# Patient Record
Sex: Female | Born: 1955 | Race: White | Hispanic: No | Marital: Married | Smoking: Never smoker
Health system: Southern US, Community
[De-identification: ages and names within clinical notes are randomized; demographics above are authoritative.]

## PROBLEM LIST (undated history)

## (undated) DIAGNOSIS — F32A Depression, unspecified: Secondary | ICD-10-CM

## (undated) DIAGNOSIS — H269 Unspecified cataract: Secondary | ICD-10-CM

## (undated) DIAGNOSIS — J45909 Unspecified asthma, uncomplicated: Secondary | ICD-10-CM

## (undated) DIAGNOSIS — R011 Cardiac murmur, unspecified: Secondary | ICD-10-CM

## (undated) DIAGNOSIS — E785 Hyperlipidemia, unspecified: Secondary | ICD-10-CM

## (undated) DIAGNOSIS — M1990 Unspecified osteoarthritis, unspecified site: Secondary | ICD-10-CM

## (undated) DIAGNOSIS — F329 Major depressive disorder, single episode, unspecified: Secondary | ICD-10-CM

## (undated) DIAGNOSIS — N189 Chronic kidney disease, unspecified: Secondary | ICD-10-CM

## (undated) DIAGNOSIS — T7840XA Allergy, unspecified, initial encounter: Secondary | ICD-10-CM

## (undated) HISTORY — DX: Major depressive disorder, single episode, unspecified: F32.9

## (undated) HISTORY — DX: Allergy, unspecified, initial encounter: T78.40XA

## (undated) HISTORY — PX: EYE SURGERY: SHX253

## (undated) HISTORY — PX: COSMETIC SURGERY: SHX468

## (undated) HISTORY — DX: Unspecified cataract: H26.9

## (undated) HISTORY — DX: Chronic kidney disease, unspecified: N18.9

## (undated) HISTORY — PX: KNEE ARTHROSCOPY: SUR90

## (undated) HISTORY — DX: Unspecified osteoarthritis, unspecified site: M19.90

## (undated) HISTORY — DX: Cardiac murmur, unspecified: R01.1

## (undated) HISTORY — PX: OTHER SURGICAL HISTORY: SHX169

## (undated) HISTORY — DX: Hyperlipidemia, unspecified: E78.5

## (undated) HISTORY — PX: WISDOM TOOTH EXTRACTION: SHX21

## (undated) HISTORY — DX: Depression, unspecified: F32.A

## (undated) HISTORY — DX: Unspecified asthma, uncomplicated: J45.909

## (undated) HISTORY — PX: COLONOSCOPY: SHX174

## (undated) HISTORY — PX: TONSILLECTOMY AND ADENOIDECTOMY: SUR1326

---

## 2005-02-12 ENCOUNTER — Ambulatory Visit: Payer: Self-pay | Admitting: Internal Medicine

## 2005-02-19 ENCOUNTER — Ambulatory Visit: Payer: Self-pay | Admitting: Internal Medicine

## 2005-03-25 ENCOUNTER — Ambulatory Visit: Payer: Self-pay | Admitting: Internal Medicine

## 2007-03-24 ENCOUNTER — Ambulatory Visit: Payer: Self-pay | Admitting: Internal Medicine

## 2007-03-24 LAB — CONVERTED CEMR LAB
ALT: 18 units/L (ref 0–35)
AST: 22 units/L (ref 0–37)
Albumin: 4 g/dL (ref 3.5–5.2)
Alkaline Phosphatase: 42 units/L (ref 39–117)
BUN: 12 mg/dL (ref 6–23)
Basophils Absolute: 0 10*3/uL (ref 0.0–0.1)
Basophils Relative: 0.2 % (ref 0.0–1.0)
Bilirubin Urine: NEGATIVE
Bilirubin, Direct: 0.1 mg/dL (ref 0.0–0.3)
CO2: 31 meq/L (ref 19–32)
Calcium: 9.1 mg/dL (ref 8.4–10.5)
Chloride: 105 meq/L (ref 96–112)
Cholesterol: 219 mg/dL (ref 0–200)
Creatinine, Ser: 1 mg/dL (ref 0.4–1.2)
Direct LDL: 136.6 mg/dL
Eosinophils Absolute: 0.2 10*3/uL (ref 0.0–0.6)
Eosinophils Relative: 2.9 % (ref 0.0–5.0)
GFR calc Af Amer: 75 mL/min
GFR calc non Af Amer: 62 mL/min
Glucose, Bld: 102 mg/dL — ABNORMAL HIGH (ref 70–99)
HCT: 37.8 % (ref 36.0–46.0)
HDL: 39.4 mg/dL (ref >39.0)
Hemoglobin: 13.5 g/dL (ref 12.0–15.0)
Lymphocytes Relative: 32.5 % (ref 12.0–46.0)
MCHC: 35.7 g/dL (ref 30.0–36.0)
MCV: 89.9 fL (ref 78.0–100.0)
Monocytes Absolute: 0.4 10*3/uL (ref 0.2–0.7)
Monocytes Relative: 7.2 % (ref 3.0–11.0)
Neutro Abs: 3 10*3/uL (ref 1.4–7.7)
Neutrophils Relative %: 57.2 % (ref 43.0–77.0)
Nitrite: NEGATIVE
Platelets: 279 10*3/uL (ref 150–400)
Potassium: 3.9 meq/L (ref 3.5–5.1)
RBC: 4.21 M/uL (ref 3.87–5.11)
RDW: 12.3 % (ref 11.5–14.6)
Sodium: 141 meq/L (ref 135–145)
Specific Gravity, Urine: 1.02
TSH: 1.61 microintl units/mL (ref 0.35–5.50)
Total Bilirubin: 0.9 mg/dL (ref 0.3–1.2)
Total CHOL/HDL Ratio: 5.6
Total Protein: 6.7 g/dL (ref 6.0–8.3)
Triglycerides: 248 mg/dL (ref 0–149)
Urobilinogen, UA: 1
VLDL: 50 mg/dL — ABNORMAL HIGH (ref 0–40)
WBC Urine, dipstick: NEGATIVE
WBC: 5.3 10*3/uL (ref 4.5–10.5)
pH: 6.5

## 2007-04-01 ENCOUNTER — Ambulatory Visit: Payer: Self-pay | Admitting: Internal Medicine

## 2007-04-15 ENCOUNTER — Encounter: Payer: Self-pay | Admitting: Internal Medicine

## 2007-04-15 ENCOUNTER — Ambulatory Visit: Payer: Self-pay

## 2007-04-19 ENCOUNTER — Telehealth: Payer: Self-pay | Admitting: Internal Medicine

## 2007-05-04 LAB — CONVERTED CEMR LAB: Pap Smear: NORMAL

## 2007-06-10 ENCOUNTER — Ambulatory Visit: Payer: Self-pay | Admitting: Cardiology

## 2007-06-16 ENCOUNTER — Encounter: Payer: Self-pay | Admitting: Cardiology

## 2007-06-16 ENCOUNTER — Ambulatory Visit: Payer: Self-pay

## 2007-06-27 ENCOUNTER — Ambulatory Visit: Payer: Self-pay | Admitting: Cardiology

## 2008-03-26 ENCOUNTER — Ambulatory Visit: Payer: Self-pay | Admitting: Internal Medicine

## 2008-03-26 LAB — CONVERTED CEMR LAB
Bilirubin Urine: NEGATIVE
Glucose, Urine, Semiquant: NEGATIVE
pH: 6

## 2008-03-27 LAB — CONVERTED CEMR LAB
Albumin: 4.1 g/dL (ref 3.5–5.2)
BUN: 19 mg/dL (ref 6–23)
Basophils Relative: 0.9 % (ref 0.0–3.0)
Bilirubin, Direct: 0.1 mg/dL (ref 0.0–0.3)
Calcium: 9.2 mg/dL (ref 8.4–10.5)
Cholesterol: 278 mg/dL (ref 0–200)
Creatinine, Ser: 1.1 mg/dL (ref 0.4–1.2)
Direct LDL: 213 mg/dL
GFR calc Af Amer: 67 mL/min
Glucose, Bld: 88 mg/dL (ref 70–99)
HCT: 38.8 % (ref 36.0–46.0)
Hemoglobin: 13.6 g/dL (ref 12.0–15.0)
MCHC: 35.1 g/dL (ref 30.0–36.0)
Monocytes Absolute: 0.3 10*3/uL (ref 0.1–1.0)
Monocytes Relative: 6.2 % (ref 3.0–12.0)
Neutro Abs: 3.1 10*3/uL (ref 1.4–7.7)
RDW: 12.2 % (ref 11.5–14.6)
Sodium: 143 meq/L (ref 135–145)
TSH: 1.53 microintl units/mL (ref 0.35–5.50)
Total CHOL/HDL Ratio: 7.2
Total Protein: 7.1 g/dL (ref 6.0–8.3)

## 2008-04-02 ENCOUNTER — Ambulatory Visit: Payer: Self-pay | Admitting: Internal Medicine

## 2008-04-02 DIAGNOSIS — E785 Hyperlipidemia, unspecified: Secondary | ICD-10-CM | POA: Insufficient documentation

## 2008-04-02 LAB — CONVERTED CEMR LAB
Glucose, Urine, Semiquant: NEGATIVE
Nitrite: NEGATIVE
Specific Gravity, Urine: 1.01
WBC Urine, dipstick: NEGATIVE
pH: 5.5

## 2008-04-05 ENCOUNTER — Telehealth: Payer: Self-pay | Admitting: Internal Medicine

## 2008-04-18 ENCOUNTER — Ambulatory Visit: Payer: Self-pay | Admitting: Cardiovascular Disease

## 2008-05-18 ENCOUNTER — Encounter: Payer: Self-pay | Admitting: Internal Medicine

## 2008-06-03 LAB — CONVERTED CEMR LAB: Pap Smear: NORMAL

## 2008-06-18 ENCOUNTER — Ambulatory Visit: Payer: Self-pay | Admitting: Internal Medicine

## 2008-06-18 LAB — CONVERTED CEMR LAB
ALT: 20 units/L (ref 0–35)
AST: 27 units/L (ref 0–37)
Bilirubin, Direct: 0.1 mg/dL (ref 0.0–0.3)
Total Bilirubin: 0.8 mg/dL (ref 0.3–1.2)
Total CHOL/HDL Ratio: 6.5

## 2008-09-10 ENCOUNTER — Ambulatory Visit (HOSPITAL_COMMUNITY): Admission: RE | Admit: 2008-09-10 | Discharge: 2008-09-10 | Payer: Self-pay | Admitting: Orthopedic Surgery

## 2008-09-10 ENCOUNTER — Ambulatory Visit: Payer: Self-pay | Admitting: Vascular Surgery

## 2008-09-10 ENCOUNTER — Encounter (INDEPENDENT_AMBULATORY_CARE_PROVIDER_SITE_OTHER): Payer: Self-pay | Admitting: Orthopedic Surgery

## 2008-09-14 ENCOUNTER — Encounter: Payer: Self-pay | Admitting: Internal Medicine

## 2008-09-14 ENCOUNTER — Telehealth: Payer: Self-pay | Admitting: Internal Medicine

## 2008-11-12 ENCOUNTER — Telehealth: Payer: Self-pay | Admitting: Internal Medicine

## 2008-11-14 ENCOUNTER — Ambulatory Visit: Payer: Self-pay | Admitting: Internal Medicine

## 2008-11-20 ENCOUNTER — Telehealth: Payer: Self-pay | Admitting: Internal Medicine

## 2008-11-20 LAB — CONVERTED CEMR LAB
ALT: 20 units/L (ref 0–35)
Bilirubin, Direct: 0 mg/dL (ref 0.0–0.3)
HDL: 43.9 mg/dL (ref >39.00)
LDL Cholesterol: 88 mg/dL (ref 0–99)
Total Bilirubin: 0.8 mg/dL (ref 0.3–1.2)
Total Protein: 6.8 g/dL (ref 6.0–8.3)
VLDL: 36 mg/dL (ref 0.0–40.0)

## 2009-02-12 ENCOUNTER — Telehealth: Payer: Self-pay | Admitting: Internal Medicine

## 2009-03-26 ENCOUNTER — Ambulatory Visit: Payer: Self-pay | Admitting: Internal Medicine

## 2009-03-26 LAB — CONVERTED CEMR LAB
ALT: 22 units/L (ref 0–35)
BUN: 17 mg/dL (ref 6–23)
Basophils Absolute: 0 10*3/uL (ref 0.0–0.1)
Bilirubin Urine: NEGATIVE
Bilirubin, Direct: 0 mg/dL (ref 0.0–0.3)
Chloride: 107 meq/L (ref 96–112)
Cholesterol: 161 mg/dL (ref 0–200)
Creatinine, Ser: 0.9 mg/dL (ref 0.4–1.2)
Eosinophils Absolute: 0.1 10*3/uL (ref 0.0–0.7)
Eosinophils Relative: 2 % (ref 0.0–5.0)
Glucose, Bld: 108 mg/dL — ABNORMAL HIGH (ref 70–99)
Glucose, Urine, Semiquant: NEGATIVE
HCT: 40.8 % (ref 36.0–46.0)
Ketones, urine, test strip: NEGATIVE
LDL Cholesterol: 95 mg/dL (ref 0–99)
Lymphs Abs: 1.4 10*3/uL (ref 0.7–4.0)
MCHC: 35 g/dL (ref 30.0–36.0)
MCV: 91.7 fL (ref 78.0–100.0)
Monocytes Absolute: 0.3 10*3/uL (ref 0.1–1.0)
Neutrophils Relative %: 66 % (ref 43.0–77.0)
Platelets: 229 10*3/uL (ref 150.0–400.0)
Potassium: 4.3 meq/L (ref 3.5–5.1)
Protein, U semiquant: NEGATIVE
RDW: 12.4 % (ref 11.5–14.6)
TSH: 1.27 microintl units/mL (ref 0.35–5.50)
Total Bilirubin: 0.9 mg/dL (ref 0.3–1.2)
Triglycerides: 113 mg/dL (ref 0.0–149.0)
Urobilinogen, UA: 0.2
WBC: 5.2 10*3/uL (ref 4.5–10.5)
pH: 5.5

## 2009-04-03 ENCOUNTER — Ambulatory Visit: Payer: Self-pay | Admitting: Internal Medicine

## 2009-05-23 ENCOUNTER — Encounter: Payer: Self-pay | Admitting: Internal Medicine

## 2009-07-25 ENCOUNTER — Encounter: Admission: RE | Admit: 2009-07-25 | Discharge: 2009-07-25 | Payer: Self-pay | Admitting: Specialist

## 2010-01-03 ENCOUNTER — Telehealth: Payer: Self-pay | Admitting: Internal Medicine

## 2010-01-07 ENCOUNTER — Encounter (INDEPENDENT_AMBULATORY_CARE_PROVIDER_SITE_OTHER): Payer: Self-pay | Admitting: *Deleted

## 2010-02-27 ENCOUNTER — Ambulatory Visit: Payer: Self-pay | Admitting: Internal Medicine

## 2010-02-28 ENCOUNTER — Encounter (INDEPENDENT_AMBULATORY_CARE_PROVIDER_SITE_OTHER): Payer: Self-pay | Admitting: *Deleted

## 2010-03-03 ENCOUNTER — Ambulatory Visit: Payer: Self-pay | Admitting: Gastroenterology

## 2010-03-12 ENCOUNTER — Ambulatory Visit: Payer: Self-pay | Admitting: Gastroenterology

## 2010-03-19 ENCOUNTER — Encounter: Payer: Self-pay | Admitting: Gastroenterology

## 2010-04-25 ENCOUNTER — Ambulatory Visit: Payer: Self-pay | Admitting: Internal Medicine

## 2010-04-25 LAB — CONVERTED CEMR LAB
Albumin: 4.2 g/dL (ref 3.5–5.2)
BUN: 16 mg/dL (ref 6–23)
Basophils Absolute: 0 10*3/uL (ref 0.0–0.1)
Bilirubin Urine: NEGATIVE
CO2: 27 meq/L (ref 19–32)
Calcium: 9.1 mg/dL (ref 8.4–10.5)
Direct LDL: 207.9 mg/dL
Eosinophils Absolute: 0.1 10*3/uL (ref 0.0–0.7)
Glucose, Bld: 86 mg/dL (ref 70–99)
Glucose, Urine, Semiquant: NEGATIVE
HCT: 39.2 % (ref 36.0–46.0)
Hemoglobin: 13.6 g/dL (ref 12.0–15.0)
Lymphs Abs: 1.8 10*3/uL (ref 0.7–4.0)
MCHC: 34.7 g/dL (ref 30.0–36.0)
Neutro Abs: 2.2 10*3/uL (ref 1.4–7.7)
Platelets: 224 10*3/uL (ref 150.0–400.0)
Potassium: 4.7 meq/L (ref 3.5–5.1)
Protein, U semiquant: NEGATIVE
RDW: 13.5 % (ref 11.5–14.6)
Sodium: 139 meq/L (ref 135–145)
Specific Gravity, Urine: 1.025
TSH: 1.37 microintl units/mL (ref 0.35–5.50)
Triglycerides: 160 mg/dL — ABNORMAL HIGH (ref 0.0–149.0)
WBC Urine, dipstick: NEGATIVE
pH: 5.5

## 2010-05-02 ENCOUNTER — Ambulatory Visit: Payer: Self-pay | Admitting: Internal Medicine

## 2010-05-02 DIAGNOSIS — R319 Hematuria, unspecified: Secondary | ICD-10-CM | POA: Insufficient documentation

## 2010-06-25 ENCOUNTER — Ambulatory Visit: Payer: Self-pay | Admitting: Internal Medicine

## 2010-06-25 LAB — CONVERTED CEMR LAB
ALT: 20 units/L (ref 0–35)
AST: 20 units/L (ref 0–37)
Alkaline Phosphatase: 69 units/L (ref 39–117)
Cholesterol: 178 mg/dL (ref 0–200)
Indirect Bilirubin: 0.4 mg/dL (ref 0.0–0.9)
Total Protein: 6.6 g/dL (ref 6.0–8.3)
Triglycerides: 111 mg/dL (ref <150)

## 2010-08-31 LAB — CONVERTED CEMR LAB: Pap Smear: NORMAL

## 2010-09-02 NOTE — Assessment & Plan Note (Signed)
Summary: ear pain/njr   Vital Signs:  Patient profile:   55 year old female Height:      67 inches Weight:      187 pounds BMI:     29.39 Temp:     98.3 degrees F oral Pulse rate:   84 / minute BP sitting:   110 / 70  (left arm) Cuff size:   regular  Vitals Entered By: Kathrynn Speed CMA (February 27, 2010 9:23 AM) CC: swollen glands, trouble swolling, pressure on throat, pressure in ears, smims alot, src   CC:  swollen glands, trouble swolling, pressure on throat, pressure in ears, smims alot, and src.  History of Present Illness: 2 week hx of sore throat feels like "glands" are swollen.  discomfort from ears to throat.  no other URI type sxs she admits to swimming a lot  She admits to being bitten by a cat 02/03/10  All other systems reviewed and were negative   Preventive Screening-Counseling & Management  Alcohol-Tobacco     Alcohol drinks/day: <1     Smoking Status: never  Current Medications (verified): 1)  Diuretic? .... Prn 2)  Fish Oil 1000 Mg Caps (Omega-3 Fatty Acids) .... Three Times A Day  Allergies (verified): No Known Drug Allergies  Past History:  Past Medical History: Last updated: 04/02/2008 none as of 02/19/05 Hyperlipidemia  Past Surgical History: Last updated: 02/21/2007 Tonsillectomy, adenoidectomy as child fibroid tumors cosmetic surgery  Family History: Last updated: 04/01/2007 mother unknown health-alcohol father deceased unknown cause pt raised by aunt and uncle  Social History: Last updated: 04/01/2007 Occupation: telecommunications Married Never Smoked Alcohol use-yes Regular exercise-yes  Risk Factors: Alcohol Use: <1 (02/27/2010) Exercise: yes (04/01/2007)  Risk Factors: Smoking Status: never (02/27/2010)  Physical Exam  General:  Well-developed,well-nourished,in no acute distress; alert,appropriate and cooperative throughout examination Head:  normocephalic and atraumatic.   Eyes:  pupils equal and pupils  round.   Neck:  No deformities, masses, or tenderness noted. Chest Wall:  No deformities, masses, or tenderness noted. Heart:  Normal rate and regular rhythm. S1 and S2 normal without gallop, murmur, click, rub or other extra sounds. 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  .... Prn 2)  Fish Oil 1000 Mg Caps (Omega-3 fatty acids) .... Three times a day 3)  Septra Ds 800-160 Mg Tabs (Sulfamethoxazole-trimethoprim) .Marland Kitchen.. 1 by mouth twice daily Prescriptions: SEPTRA DS 800-160 MG TABS (SULFAMETHOXAZOLE-TRIMETHOPRIM) 1 by mouth twice daily  #20 x 0   Entered and Authorized by:   Birdie Sons MD   Signed by:   Birdie Sons MD on 02/27/2010   Method used:   [facility]        [facility]  [place] [address] (retail)       [address] [place] [address]       [place], [place]  [address]       Ph: [phone] or [phone]       Fax: [phone]   RxID:   [id number]

## 2010-09-02 NOTE — Letter (Signed)
Summary: [facility] Instructions  [facility]  [address] [place], [place] [address]   Phone: [phone]  Fax: [phone]       Amber Jacobs    09/23/1955    MRN: [id number]        Procedure Day Dorna Bloom:  [facility]  03/12/10     Arrival Time:  8:00AM     Procedure Time:  9:00AM     Location of Procedure:                    Juliann Pares _  [facility] (4th Floor)  PREPARATION FOR COLONOSCOPY WITH MOVIPREP   Starting 5 days prior to your procedure 03/07/10 do not eat nuts, seeds, popcorn, corn, beans, peas,  salads, or any raw vegetables.  Do not take any fiber supplements (e.g. Metamucil, Citrucel, and Benefiber).  THE DAY BEFORE YOUR PROCEDURE         DATE: 03/11/10  DAY: TUESDAY  1.  Drink clear liquids the entire day-NO SOLID FOOD  2.  Do not drink anything colored red or purple.  Avoid juices with pulp.  No orange juice.  3.  Drink at least 64 oz. (8 glasses) of fluid/clear liquids during the day to prevent dehydration and help the prep work efficiently.  CLEAR LIQUIDS INCLUDE: Water Jello Ice Popsicles Tea (sugar ok, no milk/cream) Powdered fruit flavored drinks Coffee (sugar ok, no milk/cream) Gatorade Juice: apple, white grape, white cranberry  Lemonade Clear bullion, consomm, broth Carbonated beverages (any kind) Strained chicken noodle soup Hard Candy                             4.  In the morning, mix first dose of MoviPrep solution:    Empty 1 Pouch A and 1 Pouch B into the disposable container    Add lukewarm drinking water to the top line of the container. Mix to dissolve    Refrigerate (mixed solution should be used within 24 hrs)  5.  Begin drinking the prep at 5:00 p.m. The MoviPrep container is divided by 4 marks.   Every 15 minutes drink the solution down to the next mark (approximately 8 oz) until the full liter is complete.   6.  Follow completed prep with 16 oz of clear liquid of your choice (Nothing red or purple).   Continue to drink clear liquids until bedtime.  7.  Before going to bed, mix second dose of MoviPrep solution:    Empty 1 Pouch A and 1 Pouch B into the disposable container    Add lukewarm drinking water to the top line of the container. Mix to dissolve    Refrigerate  THE DAY OF YOUR PROCEDURE      DATE: 03/12/10  DAY: WEDNESDAY  Beginning at 4:00AM (5 hours before procedure):         1. Every 15 minutes, drink the solution down to the next mark (approx 8 oz) until the full liter is complete.  2. Follow completed prep with 16 oz. of clear liquid of your choice.    3. You may drink clear liquids until 7:00AM (2 HOURS BEFORE PROCEDURE).   MEDICATION INSTRUCTIONS  Unless otherwise instructed, you should take regular prescription medications with a small sip of water   as early as possible the morning of your procedure.    Additional medication instructions: n/a         OTHER INSTRUCTIONS  You  will need a responsible adult at least 56 years of age to accompany you and drive you home.   This person must remain in the waiting room during your procedure.  Wear loose fitting clothing that is easily removed.  Leave jewelry and other valuables at home.  However, you may wish to bring a book to read or  an iPod/MP3 player to listen to music as you wait for your procedure to start.  Remove all body piercing jewelry and leave at home.  Total time from sign-in until discharge is approximately 2-3 hours.  You should go home directly after your procedure and rest.  You can resume normal activities the  day after your procedure.  The day of your procedure you should not:   Drive   Make legal decisions   Operate machinery   Drink alcohol   Return to work  You will receive specific instructions about eating, activities and medications before you leave.    The above instructions have been reviewed and explained to me by  Sherren Kerns RN  March 03, 2010 4:42 PM       I fully understand and can verbalize these instructions _____________________________ Date _________

## 2010-09-02 NOTE — Letter (Signed)
Summary: Previsit letter  [facility]  [address] [place], [place] [address]   Phone: [phone]  Fax: [phone]       01/07/2010 MRN: [id number]  [facility] [address] [place], [place]  [address]  Dear Ms. Armetta,  Welcome to the Gastroenterology Division at [facility].    You are scheduled to see a nurse for your pre-procedure visit on 03/03/2010 at 4:30PM on the 3rd floor at [facility], [address]  We ask that you try to arrive at our office 15 minutes prior to your appointment time to allow for check-in.  Your nurse visit will consist of discussing your medical and surgical history, your immediate family medical history, and your medications.    Please bring a complete list of all your medications or, if you prefer, bring the medication bottles and we will list them.  We will need to be aware of both prescribed and over the counter drugs.  We will need to know exact dosage information as well.  If you are on blood thinners (Coumadin, Plavix, Aggrenox, Ticlid, etc.) please call our office today/prior to your appointment, as we need to consult with your physician about holding your medication.   Please be prepared to read and sign documents such as consent forms, a financial agreement, and acknowledgement forms.  If necessary, and with your consent, a friend or relative is welcome to sit-in on the nurse visit with you.  Please bring your insurance card so that we may make a copy of it.  If your insurance requires a referral to see a specialist, please bring your referral form from your primary care physician.  No co-pay is required for this nurse visit.     If you cannot keep your appointment, please call [phone] to cancel or reschedule prior to your appointment date.  This allows [place] the opportunity to schedule an appointment for another patient in need of care.    Thank you for choosing [facility] for your medical  needs.  We appreciate the opportunity to care for you.  Please visit [place] at our website  to learn more about our practice.                     Sincerely.                                                                                                                   The Gastroenterology Division

## 2010-09-02 NOTE — Progress Notes (Signed)
Summary: Order for colonoscopy  Phone Note Call from Patient Call back at Work Phone [phone]   Caller: Patient Call For: Birdie Sons MD Summary of Call: pt calling asking for referral for colonoscopy, pt canceled previous order. Please advise. Initial call taken by: Mervin Hack CMA Duncan Dull),  January 03, 2010 12:10 PM  Follow-up for Phone Call        ok  to refer Follow-up by: Birdie Sons MD,  January 03, 2010 1:18 PM  Additional Follow-up for Phone Call Additional follow up Details #1::        Left message saying your request has been taken care of. Additional Follow-up by: Rudy Jew, RN,  January 03, 2010 2:25 PM

## 2010-09-02 NOTE — Assessment & Plan Note (Signed)
Summary: CPX // RS   Vital Signs:  Patient profile:   55 year old female Menstrual status:  postmenopausal Height:      67 inches Weight:      191 pounds Temp:     98.7 degrees F oral Pulse rate:   80 / minute Pulse rhythm:   regular Resp:     12 per minute BP sitting:   120 / 78  (left arm) Cuff size:   regular  Vitals Entered By: Sid Falcon LPN (May 02, 2010 10:28 AM) CC: CPX, labs done Is Patient Diabetic? No     Menstrual Status postmenopausal Last PAP Result normal-pt's report   CC:  CPX and labs done.  Current Problems (verified): 1)  Hyperlipidemia  (ICD-272.4) 2)  Well Adult Exam  (ICD-V70.0)  Current Medications (verified): 1)  Fish Oil 1000 Mg Caps (Omega-3 Fatty Acids) .... 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  trial red yeast rice Labs Reviewed: SGOT: 23 (04/25/2010)   SGPT: 25 (04/25/2010)   HDL:42.30 (04/25/2010), 43.00 (03/26/2009)  LDL:95 (03/26/2009), 88  (16/05/9603)  Chol:290 (04/25/2010), 161 (03/26/2009)  Trig:160.0 (04/25/2010), 113.0 (03/26/2009)  Complete Medication List: 1)  Fish Oil 1000 Mg Caps (Omega-3 fatty acids) .... Three times a day 2)  Red Yeast Rice Extract 600 Mg Caps (Red yeast rice extract) .... Take 1 tablet by mouth two times a day  Patient Instructions: 1)  lipids 272.4 2)  liver 995.2 3)  2 months--labs only  Physical Exam General Appearance: well developed, well nourished, no acute distress Eyes: conjunctiva and lids normal, PERRL, EOMI,  Ears, Nose, Mouth, Throat: TM clear, nares clear, oral exam WNL Neck: supple, no lymphadenopathy, no thyromegaly, no JVD Respiratory: clear to auscultation and percussion, respiratory effort normal Cardiovascular: regular rate and rhythm, S1-S2, no murmur, rub or gallop, no bruits, peripheral pulses normal and symmetric, no cyanosis, clubbing, edema or varicosities Chest: no scars, masses, tenderness; no asymmetry, skin changes, nipple discharge   Gastrointestinal: soft, non-tender; no hepatosplenomegaly, masses; active bowel sounds all quadrants,  Lymphatic: no cervical, axillary or inguinal adenopathy Musculoskeletal: gait normal, muscle tone and strength WNL, no joint swelling, effusions, discoloration, crepitus  Skin: clear, good turgor, color WNL, no rashes, lesions, or ulcerations Neurologic: normal mental status, normal reflexes, normal strength, sensation, and motion Psychiatric: alert; oriented to person, place and time Other Exam:

## 2010-09-02 NOTE — Procedures (Signed)
Summary: Colonoscopy  Patient: Amber Jacobs Note: All result statuses are Final unless otherwise noted.  Tests: (1) Colonoscopy (COL)   COL Colonoscopy           DONE     [facility]     [address]     [place], [place]  [address]           COLONOSCOPY PROCEDURE REPORT     PATIENT:  Amber, Jacobs  MR#:  [id number]     BIRTHDATE:  [birth year], 54 yrs. old  GENDER:  female     ENDOSCOPIST:  Rachael Fee, MD     PROCEDURE DATE:  03/12/2010     PROCEDURE:  Colonoscopy with snare polypectomy     ASA CLASS:  Class II     INDICATIONS:  Routine Risk Screening     MEDICATIONS:   Fentanyl 50 mcg IV, Versed 5 mg IV     DESCRIPTION OF PROCEDURE:   After the risks benefits and     alternatives of the procedure were thoroughly explained, informed     consent was obtained.  Digital rectal exam was performed and     revealed no rectal masses.   The LB [id number] endoscope     was introduced through the anus and advanced to the cecum, which     was identified by both the appendix and ileocecal valve, without     limitations.  The quality of the prep was excellent, using     MoviPrep.  The instrument was then slowly withdrawn as the colon     was fully examined.     <<PROCEDUREIMAGES>>     FINDINGS:  A sessile polyp was found in the rectum. This was 4mm     across, removed with cold snare and sent to pathology (jar 1) (see     image4).  Mild diverticulosis was found in the sigmoid to     descending colon segments (see image3).  This was otherwise a     normal examination of the colon (see image2, image1, and image6).     Retroflexed views in the rectum revealed no abnormalities.    The     scope was then withdrawn from the patient and the procedure     completed.     COMPLICATIONS:  None     ENDOSCOPIC IMPRESSION:     1) Small sessile polyp in the rectum, removed and sent to     pathology     2) Mild diverticulosis in the sigmoid to descending colon     segments    3) Otherwise normal examination           RECOMMENDATIONS:     1) If the polyp(s) removed today are proven to be adenomatous     (pre-cancerous) polyps, you will need a repeat colonoscopy in 5     years. Otherwise you should continue to follow colorectal cancer     screening guidelines for "routine risk" patients with colonoscopy     in 10 years.     2) You will receive a letter within 1-2 weeks with the results     of your biopsy as well as final recommendations. Please call my     office if you have not received a letter after 3 weeks.           ______________________________     Rachael Fee, MD           n.  eSIGNED:   Rachael Fee at 03/12/2010 09:24 AM           Leverne Humbles, [id number]  Note: An exclamation mark (!) indicates a result that was not dispersed into the flowsheet. Document Creation Date: 03/12/2010 9:25 AM _______________________________________________________________________  (1) Order result status: Final Collection or observation date-time: 03/12/2010 09:20 Requested date-time:  Receipt date-time:  Reported date-time:  Referring Physician:   Ordering Physician: Rob Bunting [id number]) Specimen Source:  Source: Launa Grill Order Number: [id number] Lab site:   Appended Document: Colonoscopy     Procedures Next Due Date:    Colonoscopy: 03/2015

## 2010-09-02 NOTE — Miscellaneous (Signed)
Summary: previsit/rm  Clinical Lists Changes     [facility] unable to send at this time.Sherren Kerns RN  March 03, 2010 5:26 PM

## 2010-09-02 NOTE — Letter (Signed)
Summary: Results Letter  [facility]  [address] [place], [place] [address]   Phone: [phone]  Fax: [phone]        March 19, 2010 MRN: [id number]    YATZIRY DEAKINS [address] [place], [place]  [address]    Dear Ms. Carelli,   At least one of the polyps removed during your recent procedure was proven to be adenomatous.  These are pre-cancerous polyps that may have grown into cancers if they had not been removed.  Based on current nationally recognized surveillance guidelines, I recommend that you have a repeat colonoscopy in 5 years.  We will therefore put your information in our reminder system and will contact you in 5 years to schedule a repeat procedure.  Please call if you have any questions or concerns.       Sincerely,  Rachael Fee MD  This letter has been electronically signed by your physician.  Appended Document: Results Letter letter mailed

## 2010-12-16 NOTE — Assessment & Plan Note (Signed)
[facility]                            CARDIOLOGY OFFICE NOTE   [name], [name]                      MRN:          [id number]  DATE:06/14/2007                            DOB:          [birth year]    I spoke with Ms. Amber Jacobs by telephone today (November 11) regarding our  discussion about additional cardiac testing in view of her abnormal  Myoview. She states that she has given considerable thought to this and  does not want to proceed with a cardiac catheterization. In light of her  anterior abnormality we also felt that observation alone was not the  best approach. Our plan at this point is an exercise echocardiogram with  the idea being that if her echocardiographic images and  electrocardiogram are normal, this would suggest the possibility of  shifting soft tissue attenuation as an explanation for her abnormal  Myoview rather than frank ischemia and if that were the case, we would  be more comfortable with observation. If in fact her exercise  echocardiogram is abnormal, then she will need to give more serious  consideration to proceeding on to a cardiac catheterization. She seemed  most comfortable with this plan and I will see her back in the office to  discuss the results of her repeat testing.     Jonelle Sidle, MD  Electronically Signed    SGM/MedQ  DD: 06/14/2007  DT: 06/15/2007  Job #: [id number]   cc:   Valetta Mole. Swords, MD

## 2010-12-16 NOTE — Assessment & Plan Note (Signed)
[facility]                            CARDIOLOGY OFFICE NOTE   Amber Jacobs, Amber Jacobs                      MRN:          [id number]  DATE:06/27/2007                            DOB:          Nov 09, 1955    PRIMARY CARE PHYSICIAN:  Dr. Birdie Sons.   REASON FOR VISIT:  Followup, repeat cardiac testing.   HISTORY OF PRESENT ILLNESS:  I saw Amber Jacobs earlier in the month. Her  detailed history is outlined in my note from November 7 as well as a  followup addendum on November 11. I was concerned about an abnormal  stress test she had undergone which although not revealing any  diagnostic electrocardiographic changes suggested the possibility of  mild ischemia in the distal anterior wall and apex. We talked about the  implications of this finding although realizing that this may have  represented shifting soft tissue attenuation artifact. Our original plan  was a diagnostic cardiac catheterization although Ms. Branscomb called back  and stated that she was not at all comfortable proceeding with this. Our  compromise was to refer her for an exercise echocardiogram to see if we  might obtain similar results without the same chance of attenuation  artifact. This study was more reassuring, again showing no  electrocardiographic changes at a maximum workload of 10.2 mets. No  chest pain was reported. There were no official stress-induced wall  motion abnormalities identified (there was some mention of septal  dyssynergy with exercise but no definitive wall motion abnormality). I  discussed this with the patient today. Overall this is fairly reassuring  and Ms. Horne tells me that she has continued to exercise, in fact  increasing her intensity and has noted few symptoms. I spoke with her  today about more aggressive control of her lipids. Her LDL was in the  130s and we talked about diet and exercise. She is not interested in any  medical therapy for this. I asked  her to be very observant about any  progressive symptoms that might warrant further discussion about a  cardiac catheterization. At this point she was most comfortable with  observation.   ALLERGIES:  No known drug allergies.   CURRENT MEDICATIONS:  Oral contraceptives.   REVIEW OF SYSTEMS:  As described in the history of present illness. It  was noted.   PHYSICAL EXAMINATION:  Blood pressure today 144/83 (previously 127/77),  heart rate 80s, weight 186 pounds. Patient not otherwise examined.   IMPRESSION/RECOMMENDATIONS:  1. Followup exercise echocardiogram demonstrating no definitive      electrocardiographic changes or clearly inducible wall motion      abnormalities to suggest ischemia. Our plan at this time is to      recommend observation and risk factor modification. We talked about      diet and exercise as an initial avenue to address her mild LDL      elevation in the 409W. She should also continue to followup with      Dr. Cato Mulligan for a close eye on her blood pressure. Certainly if she      has any  progressive symptoms, I would ask her to reconsider the      possibility of a diagnostic cardiac catheterization as we had      discussed previously. For the time being, she states her symptoms      are less pronounced even with increased levels of activity, and she      is      most comfortable with observation alone.  2. Cardiology followup will be as needed at this point.     Jonelle Sidle, MD  Electronically Signed    SGM/MedQ  DD: 06/27/2007  DT: 06/28/2007  Job #: [id number]   cc:   Valetta Mole. Swords, MD

## 2010-12-16 NOTE — Assessment & Plan Note (Signed)
[facility]                            CARDIOLOGY OFFICE NOTE   VILA, DORY                      MRN:          [id number]  DATE:06/10/2007                            DOB:          11-Jan-1956    REASON FOR CONSULTATION:  Abnormal stress test.   HISTORY OF PRESENT ILLNESS:  Ms. Quattrone is a pleasant 55 year old woman  with no reported personal history of hypertension, type 2 diabetes  mellitus, hyperlipidemia, or cardiovascular disease.  She does report a  history of exercise-induced asthma and occasional wheezing.  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  She does not report any  typical exertional chest pain or dyspnea, although does state that at  her highest exertional work loads she does feel a shooting pain in her  left arm.  She otherwise experiences occasional feelings of shortness of  breath that seem to happen fairly suddenly and may last for 30 seconds  before resolving spontaneously.  This may happen once or twice a week,  and again not typically when she is exercising.  She states that she  walks up 5 flights of stairs at work and does not have to stop or become  markedly short of breath.   Her electrocardiogram today in the office is normal showing a sinus  rhythm at 87 beats per minute with an incomplete right bundle branch  block pattern with small R primes in leads V1 and V2.  She was referred  by Dr. Cato Mulligan for an exercise Myoview which was performed back in  September.  This study revealed no diagnostic ST segment changes to  suggest ischemia and her ejection fraction was normal at 72%. Perfusion  data were reported as indicating mild ischemia in the distal anterior  wall and apex.  I reviewed this test as well as the images with the  patient today and discussed its implications.  It is possible that she  has obstructive disease within the  left anterior descending based on  this study, although it is reassuring to know that her electrocardiogram  was normal with exercise.  I suppose it is also possible that the test  could be abnormal due to shifting breast attenuation artifact rather  than frank ischemia.   Today, we talked about several options including no further testing with  observation (I did not recommend this in particular), considering a  different stress testing modality such as an exercise echocardiogram to  avoid problems with attenuation artifact, considering a cardiac CT scan  as a further noninvasive test that could better clarify coronary anatomy  (although I did not specifically recommend this due to the limitations  in grading coronary stenoses with this technique), or perhaps even  considering continuing onto a diagnostic cardiac catheterization to  clearly define the coronary anatomy.  We did discuss the potential risks  and benefits of this.  After considering the matter, Ms. Greenhouse has  decided to speak with her husband and think about things a bit more with  a plan to call  us back on Monday with her decision.   ALLERGIES:  No known drug allergies.   PRESENT MEDICATIONS:  Oral contraceptive.   PAST MEDICAL HISTORY:  1. As outlined above.  2. She has undergone LASIK surgery.  3. She also had removal of a uterine fibroid back in 1988.   SOCIAL HISTORY:  The patient is married.  She has no children.  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  She denies any tobacco or  recreational drug use.  She drinks perhaps 2 alcoholic beverages a week.  She has 2 cups of caffeinated coffee a day.   FAMILY HISTORY:  Reviewed.  The patient states that she was adopted.   REVIEW OF SYSTEMS:  As described in the history of present illness,  otherwise negative.   PHYSICAL EXAMINATION:  VITAL SIGNS:  Blood pressure is 127/77, heart  rate is 87, weight is 187 pounds.  GENERAL:  The patient is in no acute  distress.  HEENT:  Conjunctivae and lids are grossly normal.  Oropharynx clear.  NECK:  Supple.  No elevated jugular venous pressure .  No loud bruits or  obvious thyromegaly.  LUNGS:  Clear without labored breathing at rest.  CARDIAC:  Reveals a regular rate and rhythm.  No loud murmur or S3  gallop.  No pericardial rub.  ABDOMEN:  With normoactive bowel sounds, nontender.  EXTREMITIES:  Show no obvious edema.  Distal pulses 2 plus.  SKIN:  Warm and dry.  MUSCULOSKELETAL:  No kyphosis is noted.  NEUROPSYCHIATRIC:  The patient is alert and oriented x3.  Affect is  normal.   IMPRESSION/RECOMMENDATION:  Symptoms of intermittent dyspnea, not always  related to exertion and no frank exertional chest pain, although with  occasional feeling of shooting discomfort in the left arm with high  levels of activity.  The symptoms have been present per report in an  intermittent fashion since February.  At baseline there is no obvious  history of hypertension, hyperlipidemia, type 2 diabetes mellitus, or  tobacco use.  The patient is adopted and her family history is not  certain.  Stress testing is abnormal suggesting the possibility of mid  to distal anterior ischemia, although her electrocardiogram was normal  with stress and her ejection fraction is normal.  As this may indicate  obstructive stenosis within the left anterior descending, clarifying  this abnormality is important.  It is for that reason that I did not  recommend simple observation at this time, although we did discuss the  possibility.  As outlined above, I reviewed a number of other options  including additional noninvasive techniques as well as a diagnostic  cardiac catheterization to clarify the matter.  We have reviewed the  potential risks and benefits of this.  She would like to consider the  issues further and discuss it with her husband and plans to call us back  on Monday to let us know her final decision and we can  schedule things  from there.  Further plans to follow.    Jonelle Sidle, MD  Electronically Signed   SGM/MedQ  DD: 06/10/2007  DT: 06/10/2007  Job #: [id number]   cc:   Valetta Mole. Swords, MD

## 2011-04-28 ENCOUNTER — Other Ambulatory Visit (INDEPENDENT_AMBULATORY_CARE_PROVIDER_SITE_OTHER): Payer: 59

## 2011-04-28 DIAGNOSIS — Z0000 Encounter for general adult medical examination without abnormal findings: Secondary | ICD-10-CM

## 2011-04-28 LAB — HEPATIC FUNCTION PANEL
ALT: 24 U/L (ref 0–35)
Bilirubin, Direct: 0.1 mg/dL (ref 0.0–0.3)
Total Bilirubin: 0.7 mg/dL (ref 0.3–1.2)

## 2011-04-28 LAB — CBC WITH DIFFERENTIAL/PLATELET
Eosinophils Absolute: 0.2 10*3/uL (ref 0.0–0.7)
Eosinophils Relative: 3.3 % (ref 0.0–5.0)
MCV: 93.9 fl (ref 78.0–100.0)
Monocytes Absolute: 0.4 10*3/uL (ref 0.1–1.0)
Neutrophils Relative %: 59.3 % (ref 43.0–77.0)
Platelets: 249 10*3/uL (ref 150.0–400.0)
WBC: 6 10*3/uL (ref 4.5–10.5)

## 2011-04-28 LAB — LIPID PANEL
HDL: 37.4 mg/dL — ABNORMAL LOW (ref >39.00)
LDL Cholesterol: 93 mg/dL (ref 0–99)
Total CHOL/HDL Ratio: 4
Triglycerides: 167 mg/dL — ABNORMAL HIGH (ref 0.0–149.0)
VLDL: 33.4 mg/dL (ref 0.0–40.0)

## 2011-04-28 LAB — POCT URINALYSIS DIPSTICK
Protein, UA: NEGATIVE
Spec Grav, UA: 1.015
Urobilinogen, UA: 0.2
pH, UA: 6.5

## 2011-04-28 LAB — BASIC METABOLIC PANEL
BUN: 14 mg/dL (ref 6–23)
Chloride: 107 mEq/L (ref 96–112)
Creatinine, Ser: 1.1 mg/dL (ref 0.4–1.2)

## 2011-05-01 ENCOUNTER — Encounter: Payer: Self-pay | Admitting: Internal Medicine

## 2011-05-04 ENCOUNTER — Ambulatory Visit (INDEPENDENT_AMBULATORY_CARE_PROVIDER_SITE_OTHER): Payer: 59 | Admitting: Internal Medicine

## 2011-05-04 ENCOUNTER — Encounter: Payer: Self-pay | Admitting: Internal Medicine

## 2011-05-04 VITALS — BP 128/88 | HR 76 | Temp 98.1°F | Ht 67.0 in | Wt 193.0 lb

## 2011-05-04 DIAGNOSIS — Z0000 Encounter for general adult medical examination without abnormal findings: Secondary | ICD-10-CM

## 2011-05-04 MED ORDER — SIMVASTATIN 40 MG PO TABS: ORAL_TABLET | ORAL | Status: DC

## 2011-05-04 NOTE — Progress Notes (Signed)
  Subjective:    Patient ID: Amber Jacobs, female    DOB: 10-30-1955, 55 y.o.   MRN: [id number]  HPI  cpx  Past Medical History  Diagnosis Date  . Hyperlipidemia    Past Surgical History  Procedure Date  . Tonsillectomy and adenoidectomy     as a child  . Fibroid tumors   . Cosmetic surgery   . Knee arthroscopy     reports that she has never smoked. She does not have any smokeless tobacco history on file. She reports that she drinks about 1.2 ounces of alcohol per week. Her drug history not on file. family history includes Alcohol abuse in her mother. Allergies not on file   Review of Systems  patient denies chest pain, shortness of breath, orthopnea. Denies lower extremity edema, abdominal pain, change in appetite, change in bowel movements. Patient denies rashes, musculoskeletal complaints. No other specific complaints in a complete review of systems.      Objective:   Physical Exam  Well-developed well-nourished female in no acute distress. HEENT exam atraumatic, normocephalic, extraocular muscles are intact. Neck is supple. No jugular venous distention no thyromegaly. Chest clear to auscultation without increased work of breathing. Cardiac exam S1 and S2 are regular. Abdominal exam active bowel sounds, soft, nontender. Extremities no edema. Neurologic exam she is alert without any motor sensory deficits. Gait is normal.     Assessment & Plan:  Well visit - health maint UTD

## 2011-06-01 ENCOUNTER — Other Ambulatory Visit: Payer: Self-pay | Admitting: Obstetrics & Gynecology

## 2011-06-12 ENCOUNTER — Ambulatory Visit (INDEPENDENT_AMBULATORY_CARE_PROVIDER_SITE_OTHER): Payer: 59 | Admitting: Internal Medicine

## 2011-06-12 VITALS — BP 134/74 | Temp 98.3°F

## 2011-06-12 DIAGNOSIS — L0293 Carbuncle, unspecified: Secondary | ICD-10-CM

## 2011-06-12 MED ORDER — DOXYCYCLINE HYCLATE 100 MG PO TABS: 100.0000 mg | ORAL_TABLET | Freq: Two times a day (BID) | ORAL | Status: AC

## 2011-06-12 NOTE — Patient Instructions (Signed)
Soak hand / finger in warm salt solution 1-2 x daily for 1 week. Take full course of antibiotics. Please call our office if your symptoms do not improve or gets worse.

## 2011-06-12 NOTE — Assessment & Plan Note (Signed)
55 year old female with symptoms of early carbuncle on left index finger. Treat with doxycycline 100 mg twice a day x10 days. Patient also advised to soak hand in warm saltwater once daily times one week. Patient advised to call office if symptoms persist or worsen. Patient may need incision and drainage.

## 2011-06-12 NOTE — Progress Notes (Signed)
  Subjective:    Patient ID: Amber Jacobs, female    DOB: 07/12/1956, 55 y.o.   MRN: [id number]  HPI  55 year old white female complains of redness on left index finger. Patient accidentally cut her finger while cooking approximately one month ago. Patient has been massaging the area due to a slight swelling. 2 and a half weeks ago area developed more swelling and redness. She has minimal tenderness. She denies any fluctuance or drainage.   Review of Systems Negative for fever Past Medical History  Diagnosis Date  . Hyperlipidemia     History   Social History  . Marital Status: Married    Spouse Name: N/A    Number of Children: N/A  . Years of Education: N/A   Occupational History  . Not on file.   Social History Main Topics  . Smoking status: Never Smoker   . Smokeless tobacco: Not on file  . Alcohol Use: 1.2 oz/week    2 Glasses of wine per week  . Drug Use: Not on file  . Sexually Active: Not on file   Other Topics Concern  . Not on file   Social History Narrative  . No narrative on file    Past Surgical History  Procedure Date  . Tonsillectomy and adenoidectomy     as a child  . Fibroid tumors   . Cosmetic surgery   . Knee arthroscopy     Family History  Problem Relation Age of Onset  . Alcohol abuse Mother     No Known Allergies  Current Outpatient Prescriptions on File Prior to Visit  Medication Sig Dispense Refill  . fish oil-omega-3 fatty acids 1000 MG capsule Take 2 g by mouth 3 (three) times daily.        . simvastatin (ZOCOR) 40 MG tablet One by mouth daily or as directed.  30 tablet  3    BP 134/74  Temp(Src) 98.3 F (36.8 C) (Oral)       Objective:   Physical Exam   Constitutional: Appears well-developed and well-nourished. No distress.  Cardiovascular: Normal rate, regular rhythm and normal heart sounds.  Exam reveals no gallop and no friction rub.  No murmur heard. Pulmonary/Chest: Effort normal and breath sounds normal.  No  wheezes. No rales.  Skin:  approximately 1 cm raised area of redness on left index finger at proximal interphalangeal joint .  Area is nontender, no drainage  Psychiatric: Normal mood and affect. Behavior is normal.       Assessment & Plan:

## 2011-08-03 ENCOUNTER — Ambulatory Visit (INDEPENDENT_AMBULATORY_CARE_PROVIDER_SITE_OTHER): Payer: 59 | Admitting: Family

## 2011-08-03 ENCOUNTER — Encounter: Payer: Self-pay | Admitting: Family

## 2011-08-03 VITALS — BP 136/80 | HR 88 | Temp 98.4°F

## 2011-08-03 DIAGNOSIS — J399 Disease of upper respiratory tract, unspecified: Secondary | ICD-10-CM

## 2011-08-03 DIAGNOSIS — J398 Other specified diseases of upper respiratory tract: Secondary | ICD-10-CM

## 2011-08-03 MED ORDER — AMOXICILLIN 500 MG PO TABS: 1000.0000 mg | ORAL_TABLET | Freq: Two times a day (BID) | ORAL | Status: AC

## 2011-08-03 NOTE — Progress Notes (Signed)
  Subjective:    Patient ID: Amber Jacobs, female    DOB: 1956/01/18, 55 y.o.   MRN: [id number]  HPI 55 year old white female, patient of Dr. towards, is in with a 9 day history of nasal congestion, sore throat, cough, and ear pain that is progressively worsening. She started taking her husband amoxicillin yesterday but has only had 2 doses. Denies any fever or, chills, nausea or vomiting, chest pain or shortness of breath.   Review of Systems  Constitutional: Negative.   HENT: Positive for congestion, rhinorrhea and sinus pressure.   Eyes: Negative.   Respiratory: Positive for cough.   Gastrointestinal: Negative.   Musculoskeletal: Negative.   Neurological: Negative.   Hematological: Negative.   Psychiatric/Behavioral: Negative.        Past Medical History  Diagnosis Date  . Hyperlipidemia     History   Social History  . Marital Status: Married    Spouse Name: N/A    Number of Children: N/A  . Years of Education: N/A   Occupational History  . Not on file.   Social History Main Topics  . Smoking status: Never Smoker   . Smokeless tobacco: Not on file  . Alcohol Use: 1.2 oz/week    2 Glasses of wine per week  . Drug Use: Not on file  . Sexually Active: Not on file   Other Topics Concern  . Not on file   Social History Narrative  . No narrative on file    Past Surgical History  Procedure Date  . Tonsillectomy and adenoidectomy     as a child  . Fibroid tumors   . Cosmetic surgery   . Knee arthroscopy     Family History  Problem Relation Age of Onset  . Alcohol abuse Mother     No Known Allergies  Current Outpatient Prescriptions on File Prior to Visit  Medication Sig Dispense Refill  . fish oil-omega-3 fatty acids 1000 MG capsule Take 2 g by mouth 3 (three) times daily.        . simvastatin (ZOCOR) 40 MG tablet One by mouth daily or as directed.  30 tablet  3    BP 136/80  Pulse 88  Temp(Src) 98.4 F (36.9 C) (Oral)chart Objective:   Physical Exam  Constitutional: She is oriented to person, place, and time. She appears well-developed.  HENT:  Right Ear: External ear normal.  Left Ear: External ear normal.  Mouth/Throat: Oropharynx is clear and moist.  Eyes: Pupils are equal, round, and reactive to light.  Neck: Neck supple.  Pulmonary/Chest: Effort normal and breath sounds normal.  Abdominal: Soft. Bowel sounds are normal.  Musculoskeletal: Normal range of motion.  Neurological: She is alert and oriented to person, place, and time.  Skin: Skin is warm and dry.  Psychiatric: She has a normal mood and affect.          Assessment & Plan:  Assessment: Upper respiratory infection  Plan: Amoxicillin 500 mg 2 capsules by mouth twice a day x10 days. Over-the-counter decongestant. Follow symptoms worsen or persist. Recheck as scheduled. When necessary

## 2011-08-03 NOTE — Patient Instructions (Signed)

## 2012-05-02 ENCOUNTER — Other Ambulatory Visit (INDEPENDENT_AMBULATORY_CARE_PROVIDER_SITE_OTHER): Payer: 59

## 2012-05-02 DIAGNOSIS — Z0000 Encounter for general adult medical examination without abnormal findings: Secondary | ICD-10-CM

## 2012-05-02 LAB — HEPATIC FUNCTION PANEL
ALT: 31 U/L (ref 0–35)
AST: 26 U/L (ref 0–37)
Alkaline Phosphatase: 52 U/L (ref 39–117)
Total Bilirubin: 0.7 mg/dL (ref 0.3–1.2)

## 2012-05-02 LAB — LIPID PANEL
Cholesterol: 175 mg/dL (ref 0–200)
LDL Cholesterol: 106 mg/dL — ABNORMAL HIGH (ref 0–99)
Triglycerides: 159 mg/dL — ABNORMAL HIGH (ref 0.0–149.0)
VLDL: 31.8 mg/dL (ref 0.0–40.0)

## 2012-05-02 LAB — CBC WITH DIFFERENTIAL/PLATELET
Basophils Absolute: 0 10*3/uL (ref 0.0–0.1)
Eosinophils Relative: 4.1 % (ref 0.0–5.0)
HCT: 42 % (ref 36.0–46.0)
Lymphs Abs: 1.3 10*3/uL (ref 0.7–4.0)
Monocytes Relative: 8.2 % (ref 3.0–12.0)
Neutrophils Relative %: 55.5 % (ref 43.0–77.0)
Platelets: 226 10*3/uL (ref 150.0–400.0)
RDW: 12.9 % (ref 11.5–14.6)
WBC: 4.1 10*3/uL — ABNORMAL LOW (ref 4.5–10.5)

## 2012-05-02 LAB — BASIC METABOLIC PANEL
BUN: 17 mg/dL (ref 6–23)
Calcium: 8.8 mg/dL (ref 8.4–10.5)
Creatinine, Ser: 1 mg/dL (ref 0.4–1.2)
GFR: 59.26 mL/min — ABNORMAL LOW (ref >60.00)
Glucose, Bld: 109 mg/dL — ABNORMAL HIGH (ref 70–99)

## 2012-05-02 LAB — POCT URINALYSIS DIPSTICK
Glucose, UA: NEGATIVE
Ketones, UA: NEGATIVE
Spec Grav, UA: 1.02
Urobilinogen, UA: 0.2

## 2012-05-02 LAB — TSH: TSH: 2.1 u[IU]/mL (ref 0.35–5.50)

## 2012-05-09 ENCOUNTER — Encounter: Payer: Self-pay | Admitting: Internal Medicine

## 2012-05-09 ENCOUNTER — Ambulatory Visit (INDEPENDENT_AMBULATORY_CARE_PROVIDER_SITE_OTHER): Payer: 59 | Admitting: Internal Medicine

## 2012-05-09 VITALS — BP 124/72 | HR 76 | Temp 98.1°F | Ht 68.0 in | Wt 185.5 lb

## 2012-05-09 DIAGNOSIS — R221 Localized swelling, mass and lump, neck: Secondary | ICD-10-CM

## 2012-05-09 DIAGNOSIS — Z0000 Encounter for general adult medical examination without abnormal findings: Secondary | ICD-10-CM

## 2012-05-09 DIAGNOSIS — J3489 Other specified disorders of nose and nasal sinuses: Secondary | ICD-10-CM

## 2012-05-09 MED ORDER — SERTRALINE HCL 50 MG PO TABS: 50.0000 mg | ORAL_TABLET | Freq: Every day | ORAL | Status: DC

## 2012-05-09 NOTE — Progress Notes (Signed)
Patient ID: Amber Jacobs, female   DOB: Nov 04, 1955, 56 y.o.   MRN: [id number]  cpx  She admits to a lot of emotional issues: work, home She is trying to exercise. She does admit to crying alone Not suicidal, homicidal  Past Medical History  Diagnosis Date  . Hyperlipidemia     History   Social History  . Marital Status: Married    Spouse Name: N/A    Number of Children: N/A  . Years of Education: N/A   Occupational History  . Not on file.   Social History Main Topics  . Smoking status: Never Smoker   . Smokeless tobacco: Not on file  . Alcohol Use: 1.2 oz/week    2 Glasses of wine per week  . Drug Use: Not on file  . Sexually Active: Not on file   Other Topics Concern  . Not on file   Social History Narrative  . No narrative on file    Past Surgical History  Procedure Date  . Tonsillectomy and adenoidectomy     as a child  . Fibroid tumors   . Cosmetic surgery   . Knee arthroscopy     Family History  Problem Relation Age of Onset  . Alcohol abuse Mother     No Known Allergies  Current Outpatient Prescriptions on File Prior to Visit  Medication Sig Dispense Refill  . fish oil-omega-3 fatty acids 1000 MG capsule Take 2 g by mouth 3 (three) times daily.        . simvastatin (ZOCOR) 40 MG tablet One by mouth daily or as directed.  30 tablet  3     patient denies chest pain, shortness of breath, orthopnea. Denies lower extremity edema, abdominal pain, change in appetite, change in bowel movements. Patient denies rashes, musculoskeletal complaints. No other specific complaints in a complete review of systems.   BP 124/72  Pulse 76  Temp 98.1 F (36.7 C) (Oral)  Ht 5\' 8"  (1.727 m)  Wt 185 lb 8 oz (84.142 kg)  BMI 28.21 kg/m2  Well-developed well-nourished female in no acute distress. HEENT exam atraumatic, normocephalic, extraocular muscles are intact. Neck is supple. No jugular venous distention no thyromegaly. Chest clear to auscultation without  increased work of breathing. Cardiac exam S1 and S2 are regular. Abdominal exam active bowel sounds, soft, nontender. Extremities no edema. Neurologic exam she is alert without any motor sensory deficits. Gait is normal.

## 2013-04-25 ENCOUNTER — Ambulatory Visit (INDEPENDENT_AMBULATORY_CARE_PROVIDER_SITE_OTHER)

## 2013-04-25 DIAGNOSIS — Z23 Encounter for immunization: Secondary | ICD-10-CM

## 2013-05-12 ENCOUNTER — Encounter: Payer: Self-pay | Admitting: Family

## 2013-05-12 ENCOUNTER — Ambulatory Visit (INDEPENDENT_AMBULATORY_CARE_PROVIDER_SITE_OTHER): Admitting: Family

## 2013-05-12 VITALS — BP 110/82 | HR 73 | Wt 194.0 lb

## 2013-05-12 DIAGNOSIS — L309 Dermatitis, unspecified: Secondary | ICD-10-CM

## 2013-05-12 DIAGNOSIS — L259 Unspecified contact dermatitis, unspecified cause: Secondary | ICD-10-CM

## 2013-05-12 NOTE — Progress Notes (Signed)
  Subjective:    Patient ID: Amber Jacobs, female    DOB: [birth year], 57 y.o.   MRN: [id number]  HPI 57 year old WF is in today with c/o a rash to the right lower leg x 4 weeks. Thinks it may be a bug bite but unsure. Reports it being slightly itchy. Has been applying antibiotic cream with no relief.    Review of Systems  Constitutional: Negative.   Respiratory: Negative.   Cardiovascular: Negative.   Skin: Positive for rash.  Allergic/Immunologic: Negative.   Neurological: Negative.   Hematological: Negative.   Psychiatric/Behavioral: Negative.    Past Medical History  Diagnosis Date  . Hyperlipidemia     History   Social History  . Marital Status: Married    Spouse Name: N/A    Number of Children: N/A  . Years of Education: N/A   Occupational History  . Not on file.   Social History Main Topics  . Smoking status: Never Smoker   . Smokeless tobacco: Not on file  . Alcohol Use: 1.2 oz/week    2 Glasses of wine per week  . Drug Use: Not on file  . Sexual Activity: Not on file   Other Topics Concern  . Not on file   Social History Narrative  . No narrative on file    Past Surgical History  Procedure Laterality Date  . Tonsillectomy and adenoidectomy      as a child  . Fibroid tumors    . Cosmetic surgery    . Knee arthroscopy      Family History  Problem Relation Age of Onset  . Alcohol abuse Mother     No Known Allergies  Current Outpatient Prescriptions on File Prior to Visit  Medication Sig Dispense Refill  . fish oil-omega-3 fatty acids 1000 MG capsule Take 2 g by mouth 3 (three) times daily.        . sertraline (ZOLOFT) 50 MG tablet Take 1 tablet (50 mg total) by mouth daily.  90 tablet  3  . simvastatin (ZOCOR) 40 MG tablet One by mouth daily or as directed.  30 tablet  3   No current facility-administered medications on file prior to visit.    BP 110/82  Pulse 73  Wt 194 lb (87.998 kg)  BMI 29.5 kg/m2chart    Objective:   Physical  Exam  Constitutional: She is oriented to person, place, and time. She appears well-developed.  Neck: Normal range of motion. Neck supple.  Cardiovascular: Normal rate, regular rhythm and normal heart sounds.   Pulmonary/Chest: Effort normal and breath sounds normal.  Neurological: She is alert and oriented to person, place, and time.  Skin: Skin is warm. Rash noted.     Psychiatric: She has a normal mood and affect.          Assessment & Plan:  Assessment: 1. Dermatitis   Plan: Hydrocortisone cream to the AA twice a day until symptoms resolve. Call with any questions or concerns.

## 2013-05-12 NOTE — Patient Instructions (Signed)
1. Hydrocortisone cream applied to the affected area.   Contact Dermatitis Contact dermatitis is a reaction to certain substances that touch the skin. Contact dermatitis can be either irritant contact dermatitis or allergic contact dermatitis. Irritant contact dermatitis does not require previous exposure to the substance for a reaction to occur.Allergic contact dermatitis only occurs if you have been exposed to the substance before. Upon a repeat exposure, your body reacts to the substance.  CAUSES  Many substances can cause contact dermatitis. Irritant dermatitis is most commonly caused by repeated exposure to mildly irritating substances, such as:  Makeup.  Soaps.  Detergents.  Bleaches.  Acids.  Metal salts, such as nickel. Allergic contact dermatitis is most commonly caused by exposure to:  Poisonous plants.  Chemicals (deodorants, shampoos).  Jewelry.  Latex.  Neomycin in triple antibiotic cream.  Preservatives in products, including clothing. SYMPTOMS  The area of skin that is exposed may develop:  Dryness or flaking.  Redness.  Cracks.  Itching.  Pain or a burning sensation.  Blisters. With allergic contact dermatitis, there may also be swelling in areas such as the eyelids, mouth, or genitals.  DIAGNOSIS  Your caregiver can usually tell what the problem is by doing a physical exam. In cases where the cause is uncertain and an allergic contact dermatitis is suspected, a patch skin test may be performed to help determine the cause of your dermatitis. TREATMENT Treatment includes protecting the skin from further contact with the irritating substance by avoiding that substance if possible. Barrier creams, powders, and gloves may be helpful. Your caregiver may also recommend:  Steroid creams or ointments applied 2 times daily. For best results, soak the rash area in cool water for 20 minutes. Then apply the medicine. Cover the area with a plastic wrap. You can  store the steroid cream in the refrigerator for a "chilly" effect on your rash. That may decrease itching. Oral steroid medicines may be needed in more severe cases.  Antibiotics or antibacterial ointments if a skin infection is present.  Antihistamine lotion or an antihistamine taken by mouth to ease itching.  Lubricants to keep moisture in your skin.  Burow's solution to reduce redness and soreness or to dry a weeping rash. Mix one packet or tablet of solution in 2 cups cool water. Dip a clean washcloth in the mixture, wring it out a bit, and put it on the affected area. Leave the cloth in place for 30 minutes. Do this as often as possible throughout the day.  Taking several cornstarch or baking soda baths daily if the area is too large to cover with a washcloth. Harsh chemicals, such as alkalis or acids, can cause skin damage that is like a burn. You should flush your skin for 15 to 20 minutes with cold water after such an exposure. You should also seek immediate medical care after exposure. Bandages (dressings), antibiotics, and pain medicine may be needed for severely irritated skin.  HOME CARE INSTRUCTIONS  Avoid the substance that caused your reaction.  Keep the area of skin that is affected away from hot water, soap, sunlight, chemicals, acidic substances, or anything else that would irritate your skin.  Do not scratch the rash. Scratching may cause the rash to become infected.  You may take cool baths to help stop the itching.  Only take over-the-counter or prescription medicines as directed by your caregiver.  See your caregiver for follow-up care as directed to make sure your skin is healing properly. SEEK MEDICAL CARE  IF:   Your condition is not better after 3 days of treatment.  You seem to be getting worse.  You see signs of infection such as swelling, tenderness, redness, soreness, or warmth in the affected area.  You have any problems related to your  medicines. Document Released: 07/17/2000 Document Revised: 10/12/2011 Document Reviewed: 12/23/2010 [facility] Patient Information 2014 [place], [place].

## 2013-05-19 ENCOUNTER — Other Ambulatory Visit: Payer: Self-pay | Admitting: Internal Medicine

## 2013-06-20 ENCOUNTER — Other Ambulatory Visit: Payer: Self-pay | Admitting: Obstetrics & Gynecology

## 2013-07-13 ENCOUNTER — Other Ambulatory Visit (INDEPENDENT_AMBULATORY_CARE_PROVIDER_SITE_OTHER)

## 2013-07-13 DIAGNOSIS — E785 Hyperlipidemia, unspecified: Secondary | ICD-10-CM

## 2013-07-13 DIAGNOSIS — Z0000 Encounter for general adult medical examination without abnormal findings: Secondary | ICD-10-CM

## 2013-07-13 LAB — HEPATIC FUNCTION PANEL
ALT: 28 U/L (ref 0–35)
Alkaline Phosphatase: 66 U/L (ref 39–117)
Bilirubin, Direct: 0 mg/dL (ref 0.0–0.3)
Total Protein: 7.4 g/dL (ref 6.0–8.3)

## 2013-07-13 LAB — CBC WITH DIFFERENTIAL/PLATELET
Basophils Absolute: 0 10*3/uL (ref 0.0–0.1)
Lymphocytes Relative: 34.8 % (ref 12.0–46.0)
Monocytes Relative: 7 % (ref 3.0–12.0)
Neutrophils Relative %: 53.5 % (ref 43.0–77.0)
Platelets: 274 10*3/uL (ref 150.0–400.0)
RDW: 13.5 % (ref 11.5–14.6)

## 2013-07-13 LAB — BASIC METABOLIC PANEL
CO2: 27 mEq/L (ref 19–32)
Chloride: 104 mEq/L (ref 96–112)
Sodium: 139 mEq/L (ref 135–145)

## 2013-07-13 LAB — POCT URINALYSIS DIPSTICK
Bilirubin, UA: NEGATIVE
Ketones, UA: NEGATIVE
Protein, UA: NEGATIVE
Spec Grav, UA: 1.02

## 2013-07-13 LAB — LIPID PANEL
Cholesterol: 215 mg/dL — ABNORMAL HIGH (ref 0–200)
HDL: 40.4 mg/dL (ref >39.00)
Total CHOL/HDL Ratio: 5
VLDL: 27.6 mg/dL (ref 0.0–40.0)

## 2013-07-28 ENCOUNTER — Encounter: Payer: Self-pay | Admitting: Internal Medicine

## 2013-07-28 ENCOUNTER — Ambulatory Visit (INDEPENDENT_AMBULATORY_CARE_PROVIDER_SITE_OTHER): Admitting: Internal Medicine

## 2013-07-28 VITALS — BP 124/82 | HR 80 | Temp 98.5°F | Ht 67.75 in | Wt 198.0 lb

## 2013-07-28 DIAGNOSIS — R319 Hematuria, unspecified: Secondary | ICD-10-CM

## 2013-07-28 DIAGNOSIS — E785 Hyperlipidemia, unspecified: Secondary | ICD-10-CM

## 2013-07-28 DIAGNOSIS — Z0000 Encounter for general adult medical examination without abnormal findings: Secondary | ICD-10-CM

## 2013-07-28 NOTE — Progress Notes (Signed)
Pre visit review using our clinic review tool, if applicable. No additional management support is needed unless otherwise documented below in the visit note. 

## 2013-07-29 NOTE — Assessment & Plan Note (Signed)
Lipids are worse She will try diet/exercise and weight loss Recheck lipids in 3-6 months

## 2013-07-29 NOTE — Progress Notes (Signed)
cpx  Past Medical History  Diagnosis Date  . Hyperlipidemia     History   Social History  . Marital Status: Married    Spouse Name: N/A    Number of Children: N/A  . Years of Education: N/A   Occupational History  . Not on file.   Social History Main Topics  . Smoking status: Never Smoker   . Smokeless tobacco: Not on file  . Alcohol Use: 1.2 oz/week    2 Glasses of wine per week  . Drug Use: Not on file  . Sexual Activity: Not on file   Other Topics Concern  . Not on file   Social History Narrative  . No narrative on file    Past Surgical History  Procedure Laterality Date  . Tonsillectomy and adenoidectomy      as a child  . Fibroid tumors    . Cosmetic surgery    . Knee arthroscopy      Family History  Problem Relation Age of Onset  . Alcohol abuse Mother     No Known Allergies  Current Outpatient Prescriptions on File Prior to Visit  Medication Sig Dispense Refill  . fish oil-omega-3 fatty acids 1000 MG capsule Take 2 g by mouth 3 (three) times daily.        . sertraline (ZOLOFT) 50 MG tablet TAKE 1 TABLET DAILY  90 tablet  0  . simvastatin (ZOCOR) 40 MG tablet One by mouth daily or as directed.  30 tablet  3   No current facility-administered medications on file prior to visit.     patient denies chest pain, shortness of breath, orthopnea. Denies lower extremity edema, abdominal pain, change in appetite, change in bowel movements. Patient denies rashes, musculoskeletal complaints. No other specific complaints in a complete review of systems.   BP 124/82  Pulse 80  Temp(Src) 98.5 F (36.9 C) (Oral)  Ht 5' 7.75" (1.721 m)  Wt 198 lb (89.812 kg)  BMI 30.32 kg/m2  Well-developed well-nourished female in no acute distress. HEENT exam atraumatic, normocephalic, extraocular muscles are intact. Neck is supple. No jugular venous distention no thyromegaly. Chest clear to auscultation without increased work of breathing. Cardiac exam S1 and S2 are regular.  Abdominal exam active bowel sounds, soft, nontender. Extremities no edema. Neurologic exam she is alert without any motor sensory deficits. Gait is normal.  Well visit- health maint UTD  HEMATURIA UNSPECIFIED Chronic problem Has been evaluated by urology  HYPERLIPIDEMIA Lipids are worse She will try diet/exercise and weight loss Recheck lipids in 3-6 months

## 2013-07-29 NOTE — Assessment & Plan Note (Signed)
Chronic problem Has been evaluated by urology

## 2013-09-06 ENCOUNTER — Other Ambulatory Visit: Payer: Self-pay | Admitting: Internal Medicine

## 2014-01-08 ENCOUNTER — Encounter: Payer: Self-pay | Admitting: Internal Medicine

## 2014-01-08 ENCOUNTER — Ambulatory Visit (INDEPENDENT_AMBULATORY_CARE_PROVIDER_SITE_OTHER): Admitting: Internal Medicine

## 2014-01-08 VITALS — BP 124/76 | HR 72 | Temp 98.3°F | Ht 67.75 in | Wt 190.0 lb

## 2014-01-08 DIAGNOSIS — R6884 Jaw pain: Secondary | ICD-10-CM

## 2014-01-08 LAB — SEDIMENTATION RATE: Sed Rate: 3 mm/hr (ref 0–22)

## 2014-01-08 NOTE — Progress Notes (Signed)
2 week hx of left>right jaw pain but she has noted that right side of jaw now has clicking. No unusual injury. Increase pain with chewing and biting hard.   No injury Went to dentist-- no relief.   Reviewed meds  BP 124/76  Pulse 72  Temp(Src) 98.3 F (36.8 C) (Oral)  Ht 5' 7.75" (1.721 m)  Wt 190 lb (86.183 kg)  BMI 29.10 kg/m2 FROM jaw No tenderness Teeth- no pain to palpation  A/p Jaw pain? ? cuase Check ESR Ibuprofen 600 mg po with meals (tid)

## 2014-01-08 NOTE — Progress Notes (Signed)
Pre visit review using our clinic review tool, if applicable. No additional management support is needed unless otherwise documented below in the visit note. 

## 2014-01-28 ENCOUNTER — Other Ambulatory Visit: Payer: Self-pay | Admitting: Internal Medicine

## 2014-04-05 ENCOUNTER — Encounter: Payer: Self-pay | Admitting: Gastroenterology

## 2014-05-28 ENCOUNTER — Ambulatory Visit (INDEPENDENT_AMBULATORY_CARE_PROVIDER_SITE_OTHER)

## 2014-05-28 DIAGNOSIS — Z23 Encounter for immunization: Secondary | ICD-10-CM

## 2014-06-13 ENCOUNTER — Encounter: Payer: Self-pay | Admitting: Family Medicine

## 2014-06-13 ENCOUNTER — Ambulatory Visit (INDEPENDENT_AMBULATORY_CARE_PROVIDER_SITE_OTHER): Admitting: Family Medicine

## 2014-06-13 VITALS — BP 120/78 | Temp 98.0°F | Wt 192.0 lb

## 2014-06-13 DIAGNOSIS — S99929A Unspecified injury of unspecified foot, initial encounter: Secondary | ICD-10-CM | POA: Insufficient documentation

## 2014-06-13 DIAGNOSIS — S99922A Unspecified injury of left foot, initial encounter: Secondary | ICD-10-CM

## 2014-06-13 NOTE — Progress Notes (Signed)
  Garret Reddish, MD Phone: [phone]  Subjective:   Amber Jacobs is a 58 y.o. year old very pleasant female patient who presents with the following:  Toenail abnormalityleft great toe Wearing nail polish over summer and when took off noted a greenish black tint under nail. She applied topical antifungal but things did not improve. Always has a history of loose nail. Tends to be a water person and in it a lot. Symptoms not progressive but antifungal did not improve. Now present since September without improvement or worsening. No other treatments tried. Patient confident no trauma.   ROS-no pain, no erythema around nail. Denies pain or itching  Past Medical History-hyperlipidemia  Medications- reviewed and updated Current Outpatient Prescriptions  Medication Sig Dispense Refill  . fish oil-omega-3 fatty acids 1000 MG capsule Take 2 g by mouth 3 (three) times daily.      . sertraline (ZOLOFT) 50 MG tablet TAKE 1 TABLET DAILY 90 tablet 3  . simvastatin (ZOCOR) 40 MG tablet One by mouth daily or as directed. 30 tablet 3   No current facility-administered medications for this visit.    Objective: BP 120/78 mmHg  Temp(Src) 98 F (36.7 C)  Wt 192 lb (87.091 kg) Gen: NAD, resting comfortably Skin: warm, dry, Greenish black discoloration under nail old left great toe-visible if lifts nail underneath and patient at times able to scrape out. No thickening to nail itself.  Neuro: grossly normal, moves all extremities, intact distal sensation      Assessment/Plan:  Injury of toenail Given greenish black discoloration with no pain, I highly suspect for pseudomonal infection. This does not have the classic appearance of onychomycosis. We will treat with solution and as per after visit summary. Follow-up in 2 weeks. Consider scraping for fungus if symptoms persist. Consider Lamisil.   Return precautions advised.

## 2014-06-13 NOTE — Assessment & Plan Note (Signed)
Given greenish black discoloration with no pain, I highly suspect for pseudomonal infection. This does not have the classic appearance of onychomycosis. We will treat with solution and as per after visit summary. Follow-up in 2 weeks. Consider scraping for fungus if symptoms persist. Consider Lamisil.

## 2014-06-13 NOTE — Patient Instructions (Signed)
This appears to be a pseudomonal nail infection. No trauma with greenish black tint without pain or local irritation of nail.   We will try recommended treatment of one part bleach and 4 parts water applied three times a day (when you wake up, before bed, and try to do when you get home from work). If you have irritation from this-vinegar is an alternative.   Try 2 weeks treatment. If no resolution, come see me and we can scrape under the nail and consider antifungal treatment.   Health Maintenance Due  Topic Date Due  . TETANUS/TDAP  08/03/2013

## 2014-07-02 ENCOUNTER — Other Ambulatory Visit: Payer: Self-pay | Admitting: Obstetrics & Gynecology

## 2014-07-02 LAB — HM MAMMOGRAPHY: HM Mammogram: NORMAL

## 2014-07-04 LAB — CYTOLOGY - PAP

## 2014-08-07 ENCOUNTER — Encounter: Payer: Self-pay | Admitting: Family Medicine

## 2014-08-07 ENCOUNTER — Ambulatory Visit (INDEPENDENT_AMBULATORY_CARE_PROVIDER_SITE_OTHER): Admitting: Family Medicine

## 2014-08-07 VITALS — BP 120/68 | Temp 98.0°F | Wt 196.0 lb

## 2014-08-07 DIAGNOSIS — S99922A Unspecified injury of left foot, initial encounter: Secondary | ICD-10-CM

## 2014-08-07 DIAGNOSIS — N183 Chronic kidney disease, stage 3 unspecified: Secondary | ICD-10-CM

## 2014-08-07 DIAGNOSIS — Z23 Encounter for immunization: Secondary | ICD-10-CM

## 2014-08-07 DIAGNOSIS — R739 Hyperglycemia, unspecified: Secondary | ICD-10-CM

## 2014-08-07 DIAGNOSIS — Z566 Other physical and mental strain related to work: Secondary | ICD-10-CM

## 2014-08-07 DIAGNOSIS — R319 Hematuria, unspecified: Secondary | ICD-10-CM

## 2014-08-07 DIAGNOSIS — E785 Hyperlipidemia, unspecified: Secondary | ICD-10-CM

## 2014-08-07 LAB — POCT URINALYSIS DIPSTICK
Bilirubin, UA: NEGATIVE
Glucose, UA: NEGATIVE
KETONES UA: NEGATIVE
Leukocytes, UA: NEGATIVE
Nitrite, UA: NEGATIVE
Protein, UA: NEGATIVE
Spec Grav, UA: 1.01
Urobilinogen, UA: 0.2
pH, UA: 5

## 2014-08-07 LAB — CBC WITH DIFFERENTIAL/PLATELET
Basophils Absolute: 0 10*3/uL (ref 0.0–0.1)
Basophils Relative: 0.6 % (ref 0.0–3.0)
EOS PCT: 4.9 % (ref 0.0–5.0)
Eosinophils Absolute: 0.3 10*3/uL (ref 0.0–0.7)
HEMATOCRIT: 41.5 % (ref 36.0–46.0)
HEMOGLOBIN: 14 g/dL (ref 12.0–15.0)
LYMPHS ABS: 1.7 10*3/uL (ref 0.7–4.0)
LYMPHS PCT: 32.9 % (ref 12.0–46.0)
MCHC: 33.7 g/dL (ref 30.0–36.0)
MCV: 88.8 fl (ref 78.0–100.0)
MONOS PCT: 7.1 % (ref 3.0–12.0)
Monocytes Absolute: 0.4 10*3/uL (ref 0.1–1.0)
Neutro Abs: 2.8 10*3/uL (ref 1.4–7.7)
Neutrophils Relative %: 54.5 % (ref 43.0–77.0)
Platelets: 276 10*3/uL (ref 150.0–400.0)
RBC: 4.67 Mil/uL (ref 3.87–5.11)
RDW: 13.7 % (ref 11.5–15.5)
WBC: 5.2 10*3/uL (ref 4.0–10.5)

## 2014-08-07 LAB — COMPREHENSIVE METABOLIC PANEL
ALBUMIN: 4.5 g/dL (ref 3.5–5.2)
ALK PHOS: 72 U/L (ref 39–117)
ALT: 45 U/L — ABNORMAL HIGH (ref 0–35)
AST: 32 U/L (ref 0–37)
BUN: 20 mg/dL (ref 6–23)
CO2: 26 meq/L (ref 19–32)
Calcium: 9.3 mg/dL (ref 8.4–10.5)
Chloride: 105 mEq/L (ref 96–112)
Creatinine, Ser: 0.9 mg/dL (ref 0.4–1.2)
GFR: 66.23 mL/min (ref >60.00)
GLUCOSE: 103 mg/dL — AB (ref 70–99)
POTASSIUM: 3.8 meq/L (ref 3.5–5.1)
SODIUM: 139 meq/L (ref 135–145)
TOTAL PROTEIN: 7.5 g/dL (ref 6.0–8.3)
Total Bilirubin: 0.7 mg/dL (ref 0.2–1.2)

## 2014-08-07 LAB — TSH: TSH: 1.62 u[IU]/mL (ref 0.35–4.50)

## 2014-08-07 LAB — LIPID PANEL
CHOLESTEROL: 258 mg/dL — AB (ref 0–200)
HDL: 42.1 mg/dL (ref >39.00)
NonHDL: 215.9
TRIGLYCERIDES: 273 mg/dL — AB (ref 0.0–149.0)
Total CHOL/HDL Ratio: 6
VLDL: 54.6 mg/dL — ABNORMAL HIGH (ref 0.0–40.0)

## 2014-08-07 LAB — LDL CHOLESTEROL, DIRECT: LDL DIRECT: 161.7 mg/dL

## 2014-08-07 LAB — HEMOGLOBIN A1C: HEMOGLOBIN A1C: 6 % (ref 4.6–6.5)

## 2014-08-07 NOTE — Assessment & Plan Note (Signed)
Improving/growing out.

## 2014-08-07 NOTE — Assessment & Plan Note (Signed)
Doing well.  Continue zoloft.  

## 2014-08-07 NOTE — Assessment & Plan Note (Signed)
Check lipids today. Patient taking simvastatin on 8 weeks off 8 weeks with red yeast rice in between. We discussed therapy with once a week simvastatin with red yeast rice in between increasing to twice a week if no myalgias. Discussed increased DM risk with this.

## 2014-08-07 NOTE — Assessment & Plan Note (Signed)
Discussed ace-i therapy to prevent progression. Patient wants to monitor for now. Discussed hematuria could be related to this-depending on if worsens over time may send to nephrology for evaluation.

## 2014-08-07 NOTE — Progress Notes (Signed)
Amber Reddish, MD Phone: [phone]  Subjective:  Patient presents today to establish care with me as their new primary care provider. Patient was formerly a patient of Dr. Leanne Chang. Chief complaint-noted.   Hyperlipidemia-poor control  Last set of labs Chol 215, HDL 40, LDL 159 On statin: Fish oil, simvastatin intermittently-takes red yeast rice when not on simvastatin Regular exercise: at least 7 times a week about 30-45 minutes Diet: poor choices-italian and eats a lot of fried foods. [place] has a lot of potatoes.  ROS- no chest pain or shortness of breath. No myalgias  We also discussed mild Leukopenia on last 2 checks and will recheck today. Discussed new diagnosis of CKD stage III. Stable and patient only wants to monitor and not start ace-i. No regular nsaid use. We also discussed at risk for diabetes/hyperglycemia with rangeds 100-113 over recent years.  ROS- no polyuria, no visible hematuria (history of microscopic evaluated by urology), no polydipsia. Updates me that toenail issue seems to be growing out-using bleach mixture daily. Her anxiety/stress at work is controlled with zoloft  The following were reviewed and entered/updated in epic: Past Medical History  Diagnosis Date  . Hyperlipidemia    Patient Active Problem List   Diagnosis Date Noted  . CKD (chronic kidney disease), stage III 08/07/2014    Priority: Medium  . Hyperglycemia 08/07/2014    Priority: Medium  . Hyperlipidemia 04/02/2008    Priority: Medium  . Stress at work 08/07/2014    Priority: Low  . Injury of toenail 06/13/2014    Priority: Low  . Hematuria 05/02/2010    Priority: Low   Past Surgical History  Procedure Laterality Date  . Tonsillectomy and adenoidectomy      as a child  . Fibroid tumors    . Cosmetic surgery      facial  . Knee arthroscopy      Family History  Problem Relation Age of Onset  . Alcohol abuse Mother     Patient was adopted and not much else known     Medications- reviewed and updated Current Outpatient Prescriptions  Medication Sig Dispense Refill  . fish oil-omega-3 fatty acids 1000 MG capsule Take 2 g by mouth 3 (three) times daily.      . sertraline (ZOLOFT) 50 MG tablet TAKE 1 TABLET DAILY 90 tablet 3  . simvastatin (ZOCOR) 40 MG tablet One by mouth daily or as directed. 30 tablet 3   No current facility-administered medications for this visit.    Allergies-reviewed and updated No Known Allergies  History   Social History  . Marital Status: Married    Spouse Name: N/A    Number of Children: N/A  . Years of Education: N/A   Social History Main Topics  . Smoking status: Never Smoker   . Smokeless tobacco: None  . Alcohol Use: 1.2 oz/week    2 Glasses of wine per week  . Drug Use: Yes     Comment: occasional marijuana perhaps once a month-goes to [place]  . Sexual Activity: None   Other Topics Concern  . None   Social History Narrative   Married 2006 (together since 1984). No children-1 aborted pregnancy. Many pets (at least 20 legs at any given time)      Works For [employer]. 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 Oropharynx normal. Good dentition.  CV: RRR no murmurs rubs or gallops Lungs: CTAB no crackles, wheeze, rhonchi Abdomen: soft/nontender/nondistended/normal bowel sounds.  Ext: no edema, 2+ PT pulses Skin: warm, dry, no rash Neuro: grossly normal, moves all extremities, PERRLA   Assessment/Plan:  Hyperlipidemia Check lipids today. Patient taking simvastatin on 8 weeks off 8 weeks with red yeast rice in between. We discussed therapy with once a week simvastatin with red yeast rice in between increasing to twice a week if no myalgias. Discussed increased DM risk with  this.   CKD (chronic kidney disease), stage III Discussed ace-i therapy to prevent progression. Patient wants to monitor for now. Discussed hematuria could be related to this-depending on if worsens over time may send to nephrology for evaluation.   Hyperglycemia Check a1c. Advised weight loss-already exercising but needs to work on portion control.   Injury of toenail Improving/growing out.   Stress at work Doing well. Continue zoloft.    Return precautions advised. 6 month follow up planned.   fasting Orders Placed This Encounter  Procedures  . Tdap vaccine greater than or equal to 7yo IM  . CBC with Differential  . Comprehensive metabolic panel    [place]    Order Specific Question:  Has the patient fasted?    Answer:  No  . Lipid panel    [place]    Order Specific Question:  Has the patient fasted?    Answer:  No  . Hemoglobin A1c    [place]  . TSH    [place]  . POCT urinalysis dipstick  Consider HIV add on if still has mild leukopenia for 1x testing.

## 2014-08-07 NOTE — Patient Instructions (Addendum)
Tdap today  Fasting labs today  I would advise 6 month follow up to check in on lifestyle changes/weight  Take simvastatin once a week, if you tolerate this, may increase to twice a week at 3 months. We can recheck bad cholesterol when I see you back and see if any improvement/worsening on changed regimen.

## 2014-08-07 NOTE — Assessment & Plan Note (Signed)
Check a1c. Advised weight loss-already exercising but needs to work on portion control.

## 2014-11-01 ENCOUNTER — Encounter: Payer: Self-pay | Admitting: Family Medicine

## 2014-11-01 ENCOUNTER — Ambulatory Visit (INDEPENDENT_AMBULATORY_CARE_PROVIDER_SITE_OTHER): Admitting: Family Medicine

## 2014-11-01 VITALS — BP 130/80 | HR 84 | Temp 98.0°F | Wt 200.0 lb

## 2014-11-01 DIAGNOSIS — M79644 Pain in right finger(s): Secondary | ICD-10-CM

## 2014-11-01 DIAGNOSIS — Z79890 Hormone replacement therapy (postmenopausal): Secondary | ICD-10-CM | POA: Insufficient documentation

## 2014-11-01 DIAGNOSIS — E785 Hyperlipidemia, unspecified: Secondary | ICD-10-CM

## 2014-11-01 MED ORDER — SIMVASTATIN 40 MG PO TABS: ORAL_TABLET | ORAL | Status: DC

## 2014-11-01 NOTE — Patient Instructions (Signed)
Not sure what the cause is of your finger redness, swelling, pain  If I had to guess, wonder if there was some puncture/splinter/injury at some point.   The joint does not seem to be causing you much pain  Refer to Dr. Tamala Julian of sports medicine to consider ultrasound  Follow up if worsening redness, pain, fevers.   Ok to use advil 1 pill twice a day for 5 days to see if it helps

## 2014-11-01 NOTE — Assessment & Plan Note (Signed)
Twice a week simvastatin. LDL poorly controlled need better control for CKD but side effects limiting- foot and leg cramps and loose stools. Check with next labs.

## 2014-11-01 NOTE — Progress Notes (Signed)
Pre visit review using our clinic review tool, if applicable. No additional management support is needed unless otherwise documented below in the visit note. 

## 2014-11-01 NOTE — Progress Notes (Signed)
  Garret Reddish, MD Phone: [phone]  Subjective:   Amber Jacobs is a 59 y.o. year old very pleasant female patient who presents with the following:  Right 5th finger pain- new Started 1 month ago. No pain with moving joint. Pain when she hits the portion of the skin that is red, swollen over 5th DIP. Saw another doctor that was worried about rheumatoid arthritis. No joint stiffness. She has tried multiple therapies-icing, biofreeze, splinting. Splinting only helps because she avoids hitting the spot. Normal grip strength. Not getting worse ROS- no expanding redness, fatigue, fevers.   Hyperlipidemia-poor control (important for CKD as well On statin: twice a week simvastatin 40mg , gets Foot cramps and loose stools next day then resolves ROS- no chest pain or shortness of breath. No myalgias  Past Medical History- Patient Active Problem List   Diagnosis Date Noted  . CKD (chronic kidney disease), stage III 08/07/2014    Priority: Medium  . Hyperglycemia 08/07/2014    Priority: Medium  . Hyperlipidemia 04/02/2008    Priority: Medium  . Stress at work 08/07/2014    Priority: Low  . Injury of toenail 06/13/2014    Priority: Low  . Hematuria 05/02/2010    Priority: Low  . Hormone replacement therapy 11/01/2014   Medications- reviewed and updated Current Outpatient Prescriptions  Medication Sig Dispense Refill  . fish oil-omega-3 fatty acids 1000 MG capsule Take 2 g by mouth 3 (three) times daily.      . sertraline (ZOLOFT) 50 MG tablet TAKE 1 TABLET DAILY 90 tablet 3  . simvastatin (ZOCOR) 40 MG tablet One by mouth daily or as directed. 30 tablet 3   No current facility-administered medications for this visit.   Objective: BP 130/80 mmHg  Pulse 84  Temp(Src) 98 F (36.7 C) (Oral)  Wt 200 lb (90.719 kg) Gen: NAD, resting comfortably  Ext: no edema except overlying joint Skin: warm, dry, no rash except erythema over 5th DIP R Neuro: grossly normal, moves all  extremities, intact distal sensation  Picture does not show well but 5th DIP on Right hand has localized erythema over joint with mild warmth. Joint movement at MCP, PIP, DIP causes no pain on any of her fingers.   She does have localized tenderness more on ulnar side with palpation and she tends to jump when area pressed hard on, more radial side of swelling does not produce pain.       Assessment/Plan:  Right 5th finger pain Unclear etiology. ? Splinter, trauma, puncture injury. Superficial to joint. Ultrasound may be beneficial-refer to Dr. Tamala Julian of sports medicine. 5 day trial aleve with known GFR around 60 only use short course.   Hyperlipidemia Twice a week simvastatin. LDL poorly controlled need better control for CKD but side effects limiting- foot and leg cramps and loose stools. Check with next labs.    Return precautions advised.   Orders Placed This Encounter  Procedures  . Ambulatory referral to Sports Medicine    Referral Priority:  Routine    Referral Type:  Consultation    Referred to Provider:  Lyndal Pulley, DO    Number of Visits Requested:  1    Meds ordered this encounter  Medications  . simvastatin (ZOCOR) 40 MG tablet    Sig: One by mouth daily or as directed.    Dispense:  90 tablet    Refill:  3

## 2014-11-19 ENCOUNTER — Encounter: Payer: Self-pay | Admitting: Family Medicine

## 2014-11-19 ENCOUNTER — Other Ambulatory Visit (INDEPENDENT_AMBULATORY_CARE_PROVIDER_SITE_OTHER)

## 2014-11-19 ENCOUNTER — Ambulatory Visit (INDEPENDENT_AMBULATORY_CARE_PROVIDER_SITE_OTHER): Admitting: Family Medicine

## 2014-11-19 VITALS — BP 112/74 | HR 73 | Ht 67.75 in | Wt 198.0 lb

## 2014-11-19 DIAGNOSIS — B0089 Other herpesviral infection: Secondary | ICD-10-CM | POA: Insufficient documentation

## 2014-11-19 DIAGNOSIS — M79644 Pain in right finger(s): Secondary | ICD-10-CM | POA: Diagnosis not present

## 2014-11-19 NOTE — Progress Notes (Signed)
[facility] [address], [place] [address] Phone: [phone] Subjective:    I'm seeing this patient by the request  of:  Garret Reddish, MD   CC: Right 5th finger pain.   [id number] Amber Jacobs is a 59 y.o. female coming in with complaint of right fifth finger pain. Patient seen by primary care provider and was concern for some mild erythema over the fifth metacarpal area. Patient states started proximal wing one month ago. No pain when moving the joint. Patient states though skin can get red and swollen. Patient states that with recurrent typing though now it is becoming uncomfortable. Patient has not tried anything different. Patient denies any numbness or tingling at all times. Sleeping comfortably at night.    Past Medical History  Diagnosis Date  . Hyperlipidemia    Past Surgical History  Procedure Laterality Date  . Tonsillectomy and adenoidectomy      as a child  . Fibroid tumors    . Cosmetic surgery      facial  . Knee arthroscopy     Family History  Problem Relation Age of Onset  . Alcohol abuse Mother     Patient was adopted and not much else known   History   Social History  . Marital Status: Married    Spouse Name: N/A  . Number of Children: N/A  . Years of Education: N/A   Occupational History  . Not on file.   Social History Main Topics  . Smoking status: Never Smoker   . Smokeless tobacco: Not on file  . Alcohol Use: 1.2 oz/week    2 Glasses of wine per week  . Drug Use: Yes     Comment: occasional marijuana perhaps once a month-goes to [place]  . Sexual Activity: Not on file   Other Topics Concern  . Not on file   Social History Narrative   Married 2006 (together since 1984). No children-1 aborted pregnancy. Many pets (at least 20 legs at any given time)      Works For [employer]. 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   Review of Systems: No headache, visual changes, nausea, vomiting, diarrhea, constipation, dizziness, abdominal pain, skin rash, fevers, chills, night sweats, weight loss, swollen lymph nodes, body aches, joint swelling, muscle aches, chest pain, shortness of breath, mood changes.   Objective Blood pressure 112/74, pulse 73, height 5' 7.75" (1.721 m), weight 198 lb (89.812 kg), SpO2 96 %.  General: No apparent distress alert and oriented x3 mood and affect normal, dressed appropriately.  HEENT: Pupils equal, extraocular movements intact  Respiratory: Patient's speak in full sentences and does not appear short of breath  Cardiovascular: No lower extremity edema, non tender, no erythema  Skin: Warm dry intact with no signs of infection or rash on extremities or on axial skeleton.  Abdomen: Soft nontender  Neuro: Cranial nerves II through XII are intact, neurovascularly intact in all extremities with 2+ DTRs and 2+ pulses.  Lymph: No lymphadenopathy of posterior or anterior cervical chain or axillae bilaterally.  Gait normal with good balance and coordination.  MSK:  Non tender with full range of motion and good stability and symmetric strength and tone of shoulders, elbows, wrist, hip, knee and ankles bilaterally.  Hand exam shows  the patient has some redness over the IP joints of the fifth metatarsal. This is only on the dorsal aspect of the hand. Nontender to exam. Patient when pressure is placed does show small vesicle formation.  Limited musculoskeletal ultrasound was performed and interpreted by Hulan Saas, M Limited ultrasound of fifth metacarpal shows what appears to be a very small fluid loculation above the joint itself within the soft tissue. No foreign body noted. No streaking noted. Impression: Herpetic whitlow  After verbal consent patient was prepped with full  spray and then liquid nitrogen was applied to the superficial aspect of the wart in question. Patient tolerated procedure well.   Impression and Recommendations:     This case required medical decision making of moderate complexity.

## 2014-11-19 NOTE — Patient Instructions (Signed)
Good to see you Herpetic whitlow.  Get Dr. Thurnell Lose liquid wart removal and apply daily for 1 week covered by a band aid.  See me in 2-3 weeks if not perfect  Herpetic Whitlow Herpetic whitlow is a painful infection of the hand. It can involve 1 or more fingers. It usually affects the end of the finger. This is caused by the Herpes simplex virus 1 (HSV-1) and herpes simplex virus 2 (HSV-2). It is an occupational risk among health care workers.  Herpetic whitlow is characterized by a starting infection, which may be followed by a problem-free period but with future recurrences. After the initial infection, the virus enters nerve endings and lies dormant in those nerves. The primary infection usually is the most troublesome. Recurrences observed in 20-50% of cases are usually milder and shorter in duration. Once nerves are infected with herpes virus they are thought to contain that virus for the rest of your life. CAUSES  Males and females are affected equally by herpetic whitlow. In health care workers, infection with HSV-1 is most common. It comes from exposure to infected secretions from the mouths of patients. Herpetic whitlow is started by exposure to infected body fluids. The virus gets in through a break in the skin. This could be any small thing such as a torn cuticle. The virus then invades the skin cells. Signs of infection show in days. In children, HSV-1 is the most likely cause. Infection involving the finger usually is due to finger-sucking or thumb-sucking in patients with herpes infection. Toddlers and preschool children are most likely to engage in thumb-sucking or finger-sucking behavior. They are susceptible to herpetic whitlow if they have herpes infection of the mouth. SYMPTOMS   Following exposure, problems usually develop within 2-20 days (incubation period). Sometimes fever and sleepiness are observed. Most often initial symptoms are pain and burning or tingling of the infected  digit.  This usually is followed by redness, swelling. There will be development of rice sized vesicles on a red base over the next 7-10 days.  These vesicles may ulcerate or break. They usually contain clear fluid. But the fluid may appear cloudy or bloody. Inflammation of the lymph channels which return the body fluids to the heart and lymph nodes (swollen glands) are common. After 10-14 days, symptoms usually improve. The sores (lesions) crust over and heal.  The infectious phase is believed to be over at this point. Complete resolution happens over the next 5-7 days.  Problems from this infection are usually related to secondary infections. Complications may include delayed resolution, bacterial overgrowth. These rarely spread throughout the body with serious consequences. DIAGNOSIS  The diagnosis is usually easily made on physical exam. Sometimes lab work is needed. HOME CARE INSTRUCTIONS   Only take over-the-counter or prescription medicines for pain, discomfort, or fever as directed by your caregiver. Do not use aspirin.  Do not touch the blisters or pick the scabs. Wash your hands often. Do not touch your eyes, mouth or genital areas without washing your hands first. Do not share towels and washcloths.  Apply an ice pack to the sore area for discomfort.  This infection is contagious. Avoid close contact with other people until blisters heal. This can be transferred to both the mouth and the genital area.  Eat a well-balanced diet.  This problem can be prevented by use of gloves. Observe fluid precautions if you are handling people.  In the general adult population, herpetic whitlow is most often from yourself. It is  most frequently secondary to infection with HSV-2. MAKE SURE YOU:   Understand these instructions.  Will watch your condition.  Will get help right away if you are not doing well or get worse. Document Released: 10/10/2002 Document Revised: 12/04/2013 Document  Reviewed: 03/08/2008 [facility] Patient Information 2015 [place], [place]. This information is not intended to replace advice given to you by your health care provider. Make sure you discuss any questions you have with your health care provider.

## 2014-11-19 NOTE — Assessment & Plan Note (Signed)
patient has more of a wart formation giving her the discomfort at this time. We discussed icing regimen and patient was applied liquid nitrogen today. Discussed over-the-counter medications as well as applying a Band-Aid for short course over the next week. Patient will follow-up in 2-3 weeks to make sure that this completely resolves. Continuing to have difficult he we may need x-ray.

## 2014-11-19 NOTE — Progress Notes (Signed)
Pre visit review using our clinic review tool, if applicable. No additional management support is needed unless otherwise documented below in the visit note. 

## 2014-12-19 ENCOUNTER — Other Ambulatory Visit: Payer: Self-pay | Admitting: Internal Medicine

## 2015-02-14 ENCOUNTER — Encounter: Payer: Self-pay | Admitting: Gastroenterology

## 2015-02-25 ENCOUNTER — Other Ambulatory Visit: Payer: Self-pay | Admitting: Obstetrics & Gynecology

## 2015-03-21 ENCOUNTER — Encounter: Payer: Self-pay | Admitting: Family Medicine

## 2015-03-21 ENCOUNTER — Ambulatory Visit (INDEPENDENT_AMBULATORY_CARE_PROVIDER_SITE_OTHER): Admitting: Family Medicine

## 2015-03-21 VITALS — BP 138/86 | HR 72 | Ht 67.75 in | Wt 198.0 lb

## 2015-03-21 DIAGNOSIS — M6620 Spontaneous rupture of extensor tendons, unspecified site: Secondary | ICD-10-CM | POA: Insufficient documentation

## 2015-03-21 DIAGNOSIS — M669 Spontaneous rupture of unspecified tendon: Secondary | ICD-10-CM

## 2015-03-21 NOTE — Progress Notes (Signed)
[facility] [address] [place], [place] [address] Phone: [phone] Subjective:     CC: Right 5th finger pain follow-up  [id number] Amber Jacobs is a 59 y.o. female coming in with complaint of right fifth finger pain. Patient was found to have more of a herpetic [place] and did have freezing done. This is 4 months ago. Patient states since then the area did get smaller at one point but then unfortunately got bigger. Does not remember any type of injury. Patient states that it never completely resolved. Patient states that now she has lost function in this little finger. States that is making it difficult that she does type on a regular basis. Patient does not remember any true injury but states that it continues to be concerning.    Past Medical History  Diagnosis Date  . Hyperlipidemia    Past Surgical History  Procedure Laterality Date  . Tonsillectomy and adenoidectomy      as a child  . Fibroid tumors    . Cosmetic surgery      facial  . Knee arthroscopy     Family History  Problem Relation Age of Onset  . Alcohol abuse Mother     Patient was adopted and not much else known   Social History   Social History  . Marital Status: Married    Spouse Name: N/A  . Number of Children: N/A  . Years of Education: N/A   Occupational History  . Not on file.   Social History Main Topics  . Smoking status: Never Smoker   . Smokeless tobacco: Not on file  . Alcohol Use: 1.2 oz/week    2 Glasses of wine per week  . Drug Use: Yes     Comment: occasional marijuana perhaps once a month-goes to [place]  . Sexual Activity: Not on file   Other Topics Concern  . Not on file   Social History Narrative   Married 2006 (together since 1984). No children-1 aborted pregnancy. Many pets (at least 20 legs at any given time)      Works For [employer]. 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   Review of Systems: No headache, visual changes, nausea, vomiting, diarrhea, constipation, dizziness, abdominal pain, skin rash, fevers, chills, night sweats, weight loss, swollen lymph nodes, body aches, joint swelling, muscle aches, chest pain, shortness of breath, mood changes.   Objective Blood pressure 138/86, pulse 72, height 5' 7.75" (1.721 m), weight 198 lb (89.812 kg), SpO2 97 %.  General: No apparent distress alert and oriented x3 mood and affect normal, dressed appropriately.  HEENT: Pupils equal, extraocular movements intact  Respiratory: Patient's speak in full sentences and does not appear short of breath  Cardiovascular: No lower extremity edema, non tender, no erythema  Skin: Warm dry intact with no signs of infection or rash on extremities or on axial skeleton.  Abdomen: Soft nontender  Neuro: Cranial nerves II through XII are intact, neurovascularly intact in all extremities with 2+ DTRs and 2+ pulses.  Lymph: No lymphadenopathy of posterior or anterior cervical chain or axillae bilaterally.  Gait normal with good balance and coordination.  MSK:  Non tender with full range of motion and good stability and symmetric strength and tone of shoulders, elbows, wrist, hip, knee  and ankles bilaterally.  Hand exam shows the patient has some redness over the DIP joints of the fifth metatarsal. This is completely different than previous exam. No longer the herpetic with low. Seems to be moderately tender to palpation. Extensor mechanism is not intact of the DIP was intact previously.  Limited musculoskeletal ultrasound was performed and interpreted by Hulan Saas, M Limited ultrasound of fifth metacarpal shows what appears to no loculation of fluid like previously. Patient though does have what seems to be a full rupture of the extensor tendon  with retraction proximal to the DIP joint. Impression: Extensor tendon rupture     Impression and Recommendations:     This case required medical decision making of moderate complexity.

## 2015-03-21 NOTE — Assessment & Plan Note (Signed)
Patient does have more what appears to be a extensor tendon rupture. This was not seen at the previous exam and patient's extensor mechanism was intact previously. Patient does notice that this is only seemed to be giving away of her work over the course last several weeks. Likely this rupture did have happened somewhere between 6 and 12 weeks ago. Discussed with patient about different treatment options patient will get a splint and try to avoid any flexion but we did want her to discuss with a hand specialist about her other treatment options including surgical repair. This is because she does have significant retraction of the tendon seen on ultrasound today. Patient can call with any questions in follow-up with me on an as-needed basis. Spent  25 minutes with patient face-to-face and had greater than 50% of counseling including as described above in assessment and plan.

## 2015-03-21 NOTE — Patient Instructions (Signed)
Good to see you Ice can help Dr. Fredna Dow will contact you See me or write when you need me.

## 2015-03-21 NOTE — Progress Notes (Signed)
Pre visit review using our clinic review tool, if applicable. No additional management support is needed unless otherwise documented below in the visit note. 

## 2015-04-05 ENCOUNTER — Ambulatory Visit (INDEPENDENT_AMBULATORY_CARE_PROVIDER_SITE_OTHER)

## 2015-04-05 DIAGNOSIS — Z23 Encounter for immunization: Secondary | ICD-10-CM

## 2015-05-13 ENCOUNTER — Other Ambulatory Visit (INDEPENDENT_AMBULATORY_CARE_PROVIDER_SITE_OTHER)

## 2015-05-13 ENCOUNTER — Ambulatory Visit (INDEPENDENT_AMBULATORY_CARE_PROVIDER_SITE_OTHER): Admitting: Family Medicine

## 2015-05-13 ENCOUNTER — Encounter: Payer: Self-pay | Admitting: Family Medicine

## 2015-05-13 VITALS — BP 130/68 | HR 80 | Ht 67.75 in | Wt 202.0 lb

## 2015-05-13 DIAGNOSIS — M6620 Spontaneous rupture of extensor tendons, unspecified site: Secondary | ICD-10-CM

## 2015-05-13 DIAGNOSIS — M669 Spontaneous rupture of unspecified tendon: Secondary | ICD-10-CM

## 2015-05-13 NOTE — Progress Notes (Signed)
[facility] [address] [place], [place] [address] Phone: [phone] Subjective:     CC: Right 5th finger pain follow-up  [id number] Amber Jacobs is a 59 y.o. female coming in with complaint of right fifth finger pain. Patient states that she has been wearing the brace consistently for the last 7 weeks. Patient has not notice any improvement. Patient states that she can flexor tendon without any trouble but continues to have difficulty with extension.    Past Medical History  Diagnosis Date  . Hyperlipidemia    Past Surgical History  Procedure Laterality Date  . Tonsillectomy and adenoidectomy      as a child  . Fibroid tumors    . Cosmetic surgery      facial  . Knee arthroscopy     Family History  Problem Relation Age of Onset  . Alcohol abuse Mother     Patient was adopted and not much else known   Social History   Social History  . Marital Status: Married    Spouse Name: N/A  . Number of Children: N/A  . Years of Education: N/A   Occupational History  . Not on file.   Social History Main Topics  . Smoking status: Never Smoker   . Smokeless tobacco: Not on file  . Alcohol Use: 1.2 oz/week    2 Glasses of wine per week  . Drug Use: Yes     Comment: occasional marijuana perhaps once a month-goes to [place]  . Sexual Activity: Not on file   Other Topics Concern  . Not on file   Social History Narrative   Married 2006 (together since 1984). No children-1 aborted pregnancy. Many pets (at least 20 legs at any given time)      Works For [employer]. 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   Review of Systems: No headache, visual changes, nausea, vomiting, diarrhea, constipation, dizziness, abdominal pain, skin rash, fevers, chills,  night sweats, weight loss, swollen lymph nodes, body aches, joint swelling, muscle aches, chest pain, shortness of breath, mood changes.   Objective Blood pressure 130/68, pulse 80, height 5' 7.75" (1.721 m), weight 202 lb (91.627 kg), SpO2 96 %.  General: No apparent distress alert and oriented x3 mood and affect normal, dressed appropriately.  HEENT: Pupils equal, extraocular movements intact  Respiratory: Patient's speak in full sentences and does not appear short of breath  Cardiovascular: No lower extremity edema, non tender, no erythema  Skin: Warm dry intact with no signs of infection or rash on extremities or on axial skeleton.  Abdomen: Soft nontender  Neuro: Cranial nerves II through XII are intact, neurovascularly intact in all extremities with 2+ DTRs and 2+ pulses.  Lymph: No lymphadenopathy of posterior or anterior cervical chain or axillae bilaterally.  Gait normal with good balance and coordination.  MSK:  Non tender with full range of motion and good stability and symmetric strength and tone of shoulders, elbows, wrist, hip, knee and ankles bilaterally.  Hand exam shows the patient has some redness over the DIP joints of the fifth metatarsal. This is completely different than previous exam. No longer the herpetic with low. Seems to be moderately tender to palpation. Extensor mechanism not intact.   Limited musculoskeletal ultrasound was performed and interpreted by  Hulan Saas, M Limited ultrasound shows.  Patient though does have what seems to be a full rupture of the extensor tendon with retraction proximal to the DIP joint. Impression: Extensor tendon rupture     Impression and Recommendations:     This case required medical decision making of moderate complexity.

## 2015-05-13 NOTE — Assessment & Plan Note (Signed)
Patient Temkin conservative therapy and unfortunately this did not help. Patient continues to have difficulty. I do believe the patient does need surgical intervention. Patient will be sent to a hand surgeon for further evaluation.

## 2015-05-13 NOTE — Patient Instructions (Signed)
Good to see you, sorry things are not better We will refer you to a hand specialist(Dr. Fredna Dow), they will call you for an appointment Continue to wear the finger splint until you are seen and they tell you otherwise Continue to ice if it helps Call me if any questions

## 2015-05-13 NOTE — Progress Notes (Signed)
Pre visit review using our clinic review tool, if applicable. No additional management support is needed unless otherwise documented below in the visit note. 

## 2015-08-20 LAB — HM MAMMOGRAPHY

## 2015-08-26 ENCOUNTER — Encounter: Payer: Self-pay | Admitting: Family Medicine

## 2015-09-14 ENCOUNTER — Other Ambulatory Visit: Payer: Self-pay | Admitting: Family Medicine

## 2015-09-16 ENCOUNTER — Other Ambulatory Visit: Payer: Self-pay | Admitting: *Deleted

## 2015-09-16 MED ORDER — SERTRALINE HCL 50 MG PO TABS: 50.0000 mg | ORAL_TABLET | Freq: Every day | ORAL | Status: DC

## 2015-09-16 NOTE — Telephone Encounter (Signed)
Rx sent to the pts local pharmacy for a 30 day supply-90 day denied as she needs an appt.

## 2015-09-20 ENCOUNTER — Encounter: Payer: Self-pay | Admitting: Nurse Practitioner

## 2015-09-20 ENCOUNTER — Telehealth: Payer: Self-pay | Admitting: Family Medicine

## 2015-09-20 ENCOUNTER — Ambulatory Visit (INDEPENDENT_AMBULATORY_CARE_PROVIDER_SITE_OTHER): Admitting: Nurse Practitioner

## 2015-09-20 VITALS — BP 134/82 | HR 74 | Temp 97.6°F | Resp 16 | Wt 199.0 lb

## 2015-09-20 DIAGNOSIS — S29011A Strain of muscle and tendon of front wall of thorax, initial encounter: Secondary | ICD-10-CM | POA: Insufficient documentation

## 2015-09-20 DIAGNOSIS — R0789 Other chest pain: Secondary | ICD-10-CM

## 2015-09-20 DIAGNOSIS — R079 Chest pain, unspecified: Secondary | ICD-10-CM | POA: Insufficient documentation

## 2015-09-20 DIAGNOSIS — S29011S Strain of muscle and tendon of front wall of thorax, sequela: Secondary | ICD-10-CM

## 2015-09-20 NOTE — Telephone Encounter (Signed)
Patient Name: Amber Jacobs  DOB: [birth year]    Initial Comment Caller states she's having chest pain, stabbing. Hurts if she lifts her left arm. Her husband says she looks gray today.   Nurse Assessment  Nurse: Raphael Gibney, RN, Vanita Ingles Date/Time (Eastern Time): 09/20/2015 8:58:47 AM  Confirm and document reason for call. If symptomatic, describe symptoms. You must click the next button to save text entered. ---Caller states she is having chest pain. Has had chest pain for 3 days. Pain is above her right breast and below her collarbone. Tender to the touch. Has been under a lot of stress. Hurts when she takes a deep breath. Hurts when she moves. No pain when she is at rest  Has the patient traveled out of the country within the last 30 days? ---No  Does the patient have any new or worsening symptoms? ---Yes  Will a triage be completed? ---Yes  Related visit to physician within the last 2 weeks? ---No  Does the PT have any chronic conditions? (i.e. diabetes, asthma, etc.) ---No  Is this a behavioral health or substance abuse call? ---No     Guidelines    Guideline Title Affirmed Question Affirmed Notes  Chest Pain Taking a deep breath makes pain worse    Final Disposition User   Go to ED Now (or PCP triage) Raphael Gibney, RN, Vera    Comments    No appts at [facility] available within 4 hrs. Office is open, appt scheduled vs sending pt to ER Appt scheduled at [facility] at 1 pm on 09/20/15 with Lorane Gell   Referrals  REFERRED TO PCP OFFICE   Disagree/Comply: Leta Baptist

## 2015-09-20 NOTE — Telephone Encounter (Signed)
Pt has an appt at [facility] at 1:00

## 2015-09-20 NOTE — Telephone Encounter (Signed)
We attempted x 3 to reach patient to have her come in this morning to be worked in but were unable to reach her. Appreciate [facility] seeing her- as always prefer to work my patients in if possible and was possible this morning.

## 2015-09-20 NOTE — Assessment & Plan Note (Signed)
EKG without ST changes or acute findings that would cause symptoms Incomplete RBBB was found and discussed with pt.  No further cardiac work up is needed at this time

## 2015-09-20 NOTE — Assessment & Plan Note (Signed)
New problem Pt declined medications Heat, ibuprofen, stretching, and stay hydrated FU prn worsening/failure to improve.

## 2015-09-20 NOTE — Progress Notes (Signed)
Pre visit review using our clinic review tool, if applicable. No additional management support is needed unless otherwise documented below in the visit note. 

## 2015-09-20 NOTE — Progress Notes (Signed)
Patient ID: Amber Jacobs, female    DOB: Jun 09, 1956  Age: 60 y.o. MRN: [id number]  CC: Chest Pain   HPI Amber Jacobs presents for CC of chest pain x 4 days.   1) Pt reports MSK pain of left upper chest near shoulder  Only hurts with movement Happened after Jury [employer]  Stressed recently  At rest doesn't bother her  Regular gym routine Feels like a strap  Stabbing sensation with deep breathing  Feels like gas is trapped   Denies radiation of pain                                                                             Treatment to date:  ASA- not helpful                         History Floetta has a past medical history of Hyperlipidemia.   She has past surgical history that includes Tonsillectomy and adenoidectomy; fibroid tumors; Cosmetic surgery; and Knee arthroscopy.   Her family history includes Alcohol abuse in her mother.She reports that she has never smoked. She does not have any smokeless tobacco history on file. She reports that she drinks about 1.2 oz of alcohol per week. She reports that she uses illicit drugs.  Outpatient Prescriptions Prior to Visit  Medication Sig Dispense Refill  . estradiol (ESTRACE) 0.5 MG tablet Take 0.5 mg by mouth daily.    . fish oil-omega-3 fatty acids 1000 MG capsule Take 2 g by mouth 3 (three) times daily.      . medroxyPROGESTERone (PROVERA) 2.5 MG tablet Take 2.5 mg by mouth daily.    . sertraline (ZOLOFT) 50 MG tablet Take 1 tablet (50 mg total) by mouth daily. 30 tablet 0  . simvastatin (ZOCOR) 40 MG tablet One by mouth daily or as directed. 90 tablet 3  . testosterone enanthate (DELATESTRYL) 200 MG/ML injection Inject into the muscle. For IM use only - every 8 weeks     No facility-administered medications prior to visit.    ROS Review of Systems  Constitutional: Negative for fever, chills, diaphoresis and fatigue.  Respiratory: Negative for apnea, cough, choking, chest tightness, shortness of breath, wheezing and  stridor.   Cardiovascular: Positive for chest pain. Negative for palpitations and leg swelling.  Skin: Negative for color change, pallor, rash and wound.  Psychiatric/Behavioral: The patient is not nervous/anxious.     Objective:  BP 134/82 mmHg  Pulse 74  Temp(Src) 97.6 F (36.4 C) (Oral)  Resp 16  Wt 199 lb (90.266 kg)  SpO2 98%  Physical Exam  Constitutional: She is oriented to person, place, and time. She appears well-developed and well-nourished. No distress.  HENT:  Head: Normocephalic and atraumatic.  Right Ear: External ear normal.  Left Ear: External ear normal.  Eyes: Right eye exhibits no discharge. Left eye exhibits no discharge. No scleral icterus.  Cardiovascular: Normal rate, regular rhythm and normal heart sounds.  Exam reveals no gallop and no friction rub.   No murmur heard. Pulmonary/Chest: Effort normal and breath sounds normal. No respiratory distress. She has no wheezes. She has no rales. She exhibits no tenderness.  Musculoskeletal: Normal range of motion.  She exhibits tenderness. She exhibits no edema.  With Left shoulder external rotation- felt tenderness inferior clavicular area and laterally near breast  All other ROM with resistance of UE were not significant for issues  No tenderness to palpation otherwise No visible changes to skin  Neurological: She is alert and oriented to person, place, and time. No cranial nerve deficit. She exhibits normal muscle tone. Coordination normal.  Skin: Skin is warm and dry. No rash noted. She is not diaphoretic.  Psychiatric: She has a normal mood and affect. Her behavior is normal. Judgment and thought content normal.   Assessment & Plan:   Shaqua was seen today for chest pain.  Diagnoses and all orders for this visit:  Other chest pain -     EKG 12-Lead  Strain of left pectoralis muscle, sequela   I am having Ms. Henrene Pastor maintain her fish oil-omega-3 fatty acids, testosterone enanthate,  medroxyPROGESTERone, estradiol, simvastatin, and sertraline.  No orders of the defined types were placed in this encounter.     Follow-up: Return if symptoms worsen or fail to improve.

## 2015-10-25 ENCOUNTER — Emergency Department (HOSPITAL_COMMUNITY)
Admission: EM | Admit: 2015-10-25 | Discharge: 2015-10-26 | Disposition: A | Discharge status: Home / Self Care | Attending: Emergency Medicine | Admitting: Emergency Medicine

## 2015-10-25 ENCOUNTER — Encounter (HOSPITAL_COMMUNITY): Payer: Self-pay | Admitting: Emergency Medicine

## 2015-10-25 ENCOUNTER — Emergency Department (HOSPITAL_COMMUNITY)

## 2015-10-25 DIAGNOSIS — Y9289 Other specified places as the place of occurrence of the external cause: Secondary | ICD-10-CM | POA: Insufficient documentation

## 2015-10-25 DIAGNOSIS — Z79899 Other long term (current) drug therapy: Secondary | ICD-10-CM | POA: Insufficient documentation

## 2015-10-25 DIAGNOSIS — S0121XA Laceration without foreign body of nose, initial encounter: Secondary | ICD-10-CM | POA: Insufficient documentation

## 2015-10-25 DIAGNOSIS — Z8639 Personal history of other endocrine, nutritional and metabolic disease: Secondary | ICD-10-CM | POA: Diagnosis not present

## 2015-10-25 DIAGNOSIS — W01198A Fall on same level from slipping, tripping and stumbling with subsequent striking against other object, initial encounter: Secondary | ICD-10-CM | POA: Insufficient documentation

## 2015-10-25 DIAGNOSIS — S0990XA Unspecified injury of head, initial encounter: Secondary | ICD-10-CM | POA: Diagnosis present

## 2015-10-25 DIAGNOSIS — Y999 Unspecified external cause status: Secondary | ICD-10-CM | POA: Insufficient documentation

## 2015-10-25 DIAGNOSIS — S8992XA Unspecified injury of left lower leg, initial encounter: Secondary | ICD-10-CM | POA: Insufficient documentation

## 2015-10-25 DIAGNOSIS — Y9389 Activity, other specified: Secondary | ICD-10-CM | POA: Insufficient documentation

## 2015-10-25 DIAGNOSIS — S0181XA Laceration without foreign body of other part of head, initial encounter: Secondary | ICD-10-CM | POA: Diagnosis not present

## 2015-10-25 DIAGNOSIS — W19XXXA Unspecified fall, initial encounter: Secondary | ICD-10-CM

## 2015-10-25 MED ORDER — ONDANSETRON HCL 4 MG/2ML IJ SOLN
4.0000 mg | Freq: Once | INTRAMUSCULAR | Status: AC
  Administered 2015-10-25: 4 mg via INTRAVENOUS
  Filled 2015-10-25: qty 2

## 2015-10-25 MED ORDER — LIDOCAINE-PRILOCAINE 2.5-2.5 % EX CREA
TOPICAL_CREAM | Freq: Once | CUTANEOUS | Status: AC
  Administered 2015-10-25: 22:00:00 via TOPICAL
  Filled 2015-10-25: qty 5

## 2015-10-25 MED ORDER — HYDROMORPHONE HCL 1 MG/ML IJ SOLN
0.5000 mg | Freq: Once | INTRAMUSCULAR | Status: AC
  Administered 2015-10-25: 0.5 mg via INTRAVENOUS
  Filled 2015-10-25: qty 1

## 2015-10-25 MED ORDER — LIDOCAINE-EPINEPHRINE-TETRACAINE (LET) SOLUTION
3.0000 mL | Freq: Once | NASAL | Status: DC
  Filled 2015-10-25: qty 3

## 2015-10-25 MED ORDER — LIDOCAINE-EPINEPHRINE-TETRACAINE (LET) TOPICAL GEL: 3.0000 mL | Freq: Once | TOPICAL | Status: DC

## 2015-10-25 NOTE — ED Provider Notes (Signed)
CSN: [id number]     Arrival date & time 10/25/15  1829 History   First MD Initiated Contact with Patient 10/25/15 1900     Chief Complaint  Patient presents with  . Fall  . Head Laceration     (Consider location/radiation/quality/duration/timing/severity/associated sxs/prior Treatment) Patient is a 60 y.o. female presenting with fall. The history is provided by the patient, a friend and the spouse.  Fall This is a new (Mechanical fall, slipped on crumbling concrete step) problem. The current episode started today (1 hour prior to arrival). The problem occurs constantly. The problem has been gradually worsening. Pertinent negatives include no abdominal pain, arthralgias, chest pain, chills, coughing, diaphoresis, fatigue, fever, headaches, myalgias, nausea, neck pain, rash, sore throat, vertigo, visual change, vomiting or weakness. Treatments tried: fentanyl via EMS. The treatment provided significant relief.    Past Medical History  Diagnosis Date  . Hyperlipidemia    Past Surgical History  Procedure Laterality Date  . Tonsillectomy and adenoidectomy      as a child  . Fibroid tumors    . Cosmetic surgery      facial  . Knee arthroscopy     Family History  Problem Relation Age of Onset  . Alcohol abuse Mother     Patient was adopted and not much else known   Social History  Substance Use Topics  . Smoking status: Never Smoker   . Smokeless tobacco: None  . Alcohol Use: 1.2 oz/week    2 Glasses of wine per week   OB History    No data available     Review of Systems  Constitutional: Negative for fever, chills, diaphoresis, activity change, appetite change and fatigue.  HENT: Positive for facial swelling. Negative for rhinorrhea, sore throat, trouble swallowing and voice change.   Eyes: Negative for photophobia, pain and visual disturbance.  Respiratory: Negative for cough, shortness of breath, wheezing and stridor.   Cardiovascular: Negative for chest pain,  palpitations and leg swelling.  Gastrointestinal: Negative for nausea, vomiting, abdominal pain, constipation and anal bleeding.  Endocrine: Negative.   Genitourinary: Negative for dysuria, vaginal bleeding, vaginal discharge and vaginal pain.  Musculoskeletal: Negative for myalgias, back pain, arthralgias and neck pain.  Skin: Positive for wound. Negative for rash.  Allergic/Immunologic: Negative.   Neurological: Negative for dizziness, vertigo, tremors, syncope, weakness and headaches.  Psychiatric/Behavioral: Negative for suicidal ideas, sleep disturbance and self-injury.  All other systems reviewed and are negative.     Allergies  Zocor  Home Medications   Prior to Admission medications   Medication Sig Start Date End Date Taking? Authorizing Provider  Multiple Vitamin (MULTIVITAMIN) tablet Take 1 tablet by mouth daily.   Yes Historical Provider, MD  Omega-3 1000 MG CAPS Take 2,000 mg by mouth every morning.    Yes Historical Provider, MD  sertraline (ZOLOFT) 50 MG tablet Take 1 tablet (50 mg total) by mouth daily. 09/16/15  Yes Marin Olp, MD  cephALEXin (KEFLEX) 500 MG capsule Take 1 capsule (500 mg total) by mouth 3 (three) times daily. 10/26/15 11/01/15  Margaretann Loveless, MD  oxyCODONE-acetaminophen (PERCOCET/ROXICET) 5-325 MG tablet Take 1 tablet by mouth every 4 (four) hours as needed for severe pain. 10/26/15   Margaretann Loveless, MD  simvastatin (ZOCOR) 40 MG tablet One by mouth daily or as directed. Patient not taking: Reported on 10/25/2015 11/01/14   Marin Olp, MD   BP 123/61 mmHg  Pulse 73  Temp(Src) 97.5 F (36.4 C) (Oral)  Resp 18  SpO2 96% Physical Exam  Constitutional: She is oriented to person, place, and time. She appears well-developed and well-nourished. No distress.  HENT:  Head: Normocephalic.  Right Ear: External ear normal.  Left Ear: External ear normal.  Mouth/Throat: Oropharynx is clear and moist. No oropharyngeal exudate.  4 cm laceration of  anterior nose with no exposed cartillage. No nasal septal hematoma. 2 cm laceration above left orbit.  Eyes: Conjunctivae and EOM are normal. Pupils are equal, round, and reactive to light. No scleral icterus.  Visual acuity normal. Full ROM of eyes.   Neck: Normal range of motion. Neck supple. No JVD present. No tracheal deviation present. No thyromegaly present.  No midline neck tenderness to palpation or with range of motion of neck. Patient NEXUS negative.   Cardiovascular: Normal rate, regular rhythm and intact distal pulses.  Exam reveals no gallop and no friction rub.   No murmur heard. Pulmonary/Chest: Effort normal and breath sounds normal. No respiratory distress. She has no wheezes. She has no rales.  Abdominal: Soft. Bowel sounds are normal. She exhibits no distension. There is no tenderness.  Musculoskeletal: Normal range of motion. She exhibits tenderness (minor left knee tenderness). She exhibits no edema.  Neurological: She is alert and oriented to person, place, and time. No cranial nerve deficit. She exhibits normal muscle tone. Coordination normal.  GCS 15. 5/5 strength in all 4 extremities. Normal Gait.   Skin: Skin is warm and dry. She is not diaphoretic. No pallor.  Psychiatric: She has a normal mood and affect. She expresses no homicidal and no suicidal ideation. She expresses no suicidal plans and no homicidal plans.  Nursing note and vitals reviewed.   ED Course  .Marland KitchenLaceration Repair Date/Time: 10/26/2015 12:58 AM Performed by: Margaretann Loveless Authorized by: Leonard Schwartz Consent: Verbal consent obtained. Risks and benefits: risks, benefits and alternatives were discussed Consent given by: patient and spouse Patient identity confirmed: verbally with patient, arm band and hospital-assigned identification number Time out: Immediately prior to procedure a "time out" was called to verify the correct patient, procedure, equipment, support staff and site/side marked as  required. Body area: head/neck Location details: nose Laceration length: 4 cm Foreign bodies: no foreign bodies Tendon involvement: none Nerve involvement: none Local anesthetic: LET (lido,epi,tetracaine) Anesthetic total: 5 ml Irrigation solution: saline Irrigation method: syringe Amount of cleaning: extensive Wound skin closure material used: 5-0 Fast Absorbing Gut. Number of sutures: 7 Technique: simple Approximation: close Approximation difficulty: complex Patient tolerance: Patient tolerated the procedure well with no immediate complications  .Marland KitchenLaceration Repair Date/Time: 10/26/2015 12:59 AM Performed by: Margaretann Loveless Authorized by: Leonard Schwartz Consent: Verbal consent obtained. Risks and benefits: risks, benefits and alternatives were discussed Consent given by: patient Patient understanding: patient states understanding of the procedure being performed Patient identity confirmed: verbally with patient, arm band and hospital-assigned identification number Time out: Immediately prior to procedure a "time out" was called to verify the correct patient, procedure, equipment, support staff and site/side marked as required. Body area: head/neck Location details: forehead Laceration length: 2 cm Foreign bodies: no foreign bodies Tendon involvement: none Nerve involvement: none Anesthesia: local infiltration Local anesthetic: LET (lido,epi,tetracaine) Anesthetic total: 2 ml Preparation: Patient was prepped and draped in the usual sterile fashion. Irrigation solution: saline Irrigation method: jet lavage Amount of cleaning: extensive Debridement: none Degree of undermining: none Wound skin closure material used: 5-0 Fast Absorbing Gut. Number of sutures: 3 Technique: simple Approximation: close Approximation difficulty: simple Patient tolerance: Patient tolerated the procedure well with  no immediate complications   (including critical care time) Labs Review Labs  Reviewed - No data to display  Imaging Review Ct Head Wo Contrast  10/25/2015  CLINICAL DATA:  Patient fell and hit forehead on concrete.  Pain. EXAM: CT HEAD WITHOUT CONTRAST CT MAXILLOFACIAL WITHOUT CONTRAST TECHNIQUE: Multidetector CT imaging of the head and maxillofacial structures were performed using the standard protocol without intravenous contrast. Multiplanar CT image reconstructions of the maxillofacial structures were also generated. COMPARISON:  None. FINDINGS: CT HEAD FINDINGS No evidence for acute infarction, hemorrhage, mass lesion, hydrocephalus, or extra-axial fluid. Normal for age cerebral volume. No white matter disease of significance. Calvarium intact. No contrecoup injury. LEFT frontal scalp hematoma with laceration. CT MAXILLOFACIAL FINDINGS Comminuted nasal bone fractures. Concomitant soft tissue swelling of the nose. Nasal cavity hematoma. No sinus air-fluid level. No blowout injury. No other facial fractures. TMJs located. Mandible and maxilla grossly intact. Globes intact. No orbital hematoma. Cervical spondylosis at C5-C6 without visible upper cervical fracture or subluxation. IMPRESSION: Comminuted nasal bone fractures. No other facial fracture, blowout injury, or sinus air-fluid level. No skull fracture or intracranial hemorrhage. LEFT frontal scalp hematoma with laceration. Electronically Signed   By: Staci Righter M.D.   On: 10/25/2015 20:25   Dg Knee Complete 4 Views Left  10/26/2015  CLINICAL DATA:  [place] today.  Left knee pain and abrasions. EXAM: LEFT KNEE - COMPLETE 4+ VIEW COMPARISON:  MRI left knee 09/04/2008 FINDINGS: Postoperative changes in the left knee consistent with anterior cruciate ligament repair. Mild degenerative changes with medial compartment narrowing and small osteophyte formation. No evidence of acute fracture or dislocation. No focal bone lesion or bone destruction. No significant effusion. IMPRESSION: Postoperative and mild degenerative changes in the  left knee. No acute bony abnormalities. Electronically Signed   By: Lucienne Capers M.D.   On: 10/26/2015 00:25   Ct Maxillofacial Wo Cm  10/25/2015  CLINICAL DATA:  Patient fell and hit forehead on concrete.  Pain. EXAM: CT HEAD WITHOUT CONTRAST CT MAXILLOFACIAL WITHOUT CONTRAST TECHNIQUE: Multidetector CT imaging of the head and maxillofacial structures were performed using the standard protocol without intravenous contrast. Multiplanar CT image reconstructions of the maxillofacial structures were also generated. COMPARISON:  None. FINDINGS: CT HEAD FINDINGS No evidence for acute infarction, hemorrhage, mass lesion, hydrocephalus, or extra-axial fluid. Normal for age cerebral volume. No white matter disease of significance. Calvarium intact. No contrecoup injury. LEFT frontal scalp hematoma with laceration. CT MAXILLOFACIAL FINDINGS Comminuted nasal bone fractures. Concomitant soft tissue swelling of the nose. Nasal cavity hematoma. No sinus air-fluid level. No blowout injury. No other facial fractures. TMJs located. Mandible and maxilla grossly intact. Globes intact. No orbital hematoma. Cervical spondylosis at C5-C6 without visible upper cervical fracture or subluxation. IMPRESSION: Comminuted nasal bone fractures. No other facial fracture, blowout injury, or sinus air-fluid level. No skull fracture or intracranial hemorrhage. LEFT frontal scalp hematoma with laceration. Electronically Signed   By: Staci Righter M.D.   On: 10/25/2015 20:25   I have personally reviewed and evaluated these images and lab results as part of my medical decision-making.   EKG Interpretation None      MDM   Final diagnoses:  Fall, initial encounter  Facial laceration, initial encounter    The patient is a 60 year old female who presents after suffering a mechanical fall. She reports that she slipped on a crumbling concrete step causing her to fall face forward onto the concrete step. No loss of consciousness. No  shortness of breath or  lightheadedness preceding the fall. On arrival patient is afebrile hemolytically stable. Physical exam as above. Head CT shows no acute intercranial pathology. A CT shows a comminuted nasal bone fracture. No nasal septal hematoma. I discussed the case with the Plastic Surgeon trauma attending on call, Dr. Iran Planas, who recommends closure in the ED by the ED physician as well as antibiotic prophalxsis and Plastic Surgery follow up on Monday. Tetanus already up to date per patient. Wound repaired in the emergency department as above. Patient discharged with prescription for Keflex and pain control as well as plastic surgery follow-up. Standard wound care and ED return precautions given.  Patient seen with attending, Dr. Audie Pinto, who oversaw clinical decision making.     Margaretann Loveless, MD 10/26/15 [id number]  Leonard Schwartz, MD 10/26/15 [phone]

## 2015-10-25 NOTE — ED Notes (Signed)
Pt to ER via [facility] after tripping upstairs and falling hitting head. No LOC. No dizziness prior to fall. Pt has lac to left upper orbit. No blood thinners. VSS. Received 100 mcg of fentanyl in route for pain 8/10 to face. A/O x4

## 2015-10-25 NOTE — ED Notes (Signed)
Pt given ice pack for left knee.

## 2015-10-25 NOTE — ED Notes (Signed)
Patient transported to CT 

## 2015-10-25 NOTE — ED Notes (Signed)
Dr. Goebel at bedside. 

## 2015-10-26 ENCOUNTER — Emergency Department (HOSPITAL_COMMUNITY)

## 2015-10-26 DIAGNOSIS — S0121XA Laceration without foreign body of nose, initial encounter: Secondary | ICD-10-CM | POA: Diagnosis not present

## 2015-10-26 MED ORDER — OXYCODONE-ACETAMINOPHEN 5-325 MG PO TABS: 1.0000 | ORAL_TABLET | ORAL | Status: DC | PRN

## 2015-10-26 MED ORDER — CEPHALEXIN 500 MG PO CAPS: 500.0000 mg | ORAL_CAPSULE | Freq: Three times a day (TID) | ORAL | Status: AC

## 2015-10-26 NOTE — Discharge Instructions (Signed)
Facial Laceration ° A facial laceration is a cut on the face. These injuries can be painful and cause bleeding. Lacerations usually heal quickly, but they need special care to reduce scarring. °DIAGNOSIS  °Your health care provider will take a medical history, ask for details about how the injury occurred, and examine the wound to determine how deep the cut is. °TREATMENT  °Some facial lacerations may not require closure. Others may not be able to be closed because of an increased risk of infection. The risk of infection and the chance for successful closure will depend on various factors, including the amount of time since the injury occurred. °The wound may be cleaned to help prevent infection. If closure is appropriate, pain medicines may be given if needed. Your health care provider will use stitches (sutures), wound glue (adhesive), or skin adhesive strips to repair the laceration. These tools bring the skin edges together to allow for faster healing and a better cosmetic outcome. If needed, you may also be given a tetanus shot. °HOME CARE INSTRUCTIONS °· Only take over-the-counter or prescription medicines as directed by your health care provider. °· Follow your health care provider's instructions for wound care. These instructions will vary depending on the technique used for closing the wound. °For Sutures: °· Keep the wound clean and dry.   °· If you were given a bandage (dressing), you should change it at least once a day. Also change the dressing if it becomes wet or dirty, or as directed by your health care provider.   °· Wash the wound with soap and water 2 times a day. Rinse the wound off with water to remove all soap. Pat the wound dry with a clean towel.   °· After cleaning, apply a thin layer of the antibiotic ointment recommended by your health care provider. This will help prevent infection and keep the dressing from sticking.   °· You may shower as usual after the first 24 hours. Do not soak the  wound in water until the sutures are removed.   °· Get your sutures removed as directed by your health care provider. With facial lacerations, sutures should usually be taken out after 4-5 days to avoid stitch marks.   °· Wait a few days after your sutures are removed before applying any makeup. °For Skin Adhesive Strips: °· Keep the wound clean and dry.   °· Do not get the skin adhesive strips wet. You may bathe carefully, using caution to keep the wound dry.   °· If the wound gets wet, pat it dry with a clean towel.   °· Skin adhesive strips will fall off on their own. You may trim the strips as the wound heals. Do not remove skin adhesive strips that are still stuck to the wound. They will fall off in time.   °For Wound Adhesive: °· You may briefly wet your wound in the shower or bath. Do not soak or scrub the wound. Do not swim. Avoid periods of heavy sweating until the skin adhesive has fallen off on its own. After showering or bathing, gently pat the wound dry with a clean towel.   °· Do not apply liquid medicine, cream medicine, ointment medicine, or makeup to your wound while the skin adhesive is in place. This may loosen the film before your wound is healed.   °· If a dressing is placed over the wound, be careful not to apply tape directly over the skin adhesive. This may cause the adhesive to be pulled off before the wound is healed.   °· Avoid   prolonged exposure to sunlight or tanning lamps while the skin adhesive is in place. °· The skin adhesive will usually remain in place for 5-10 days, then naturally fall off the skin. Do not pick at the adhesive film.   °After Healing: °Once the wound has healed, cover the wound with sunscreen during the day for 1 full year. This can help minimize scarring. Exposure to ultraviolet light in the first year will darken the scar. It can take 1-2 years for the scar to lose its redness and to heal completely.  °SEEK MEDICAL CARE IF: °· You have a fever. °SEEK IMMEDIATE  MEDICAL CARE IF: °· You have redness, pain, or swelling around the wound.   °· You see a yellowish-white fluid (pus) coming from the wound.   °  °This information is not intended to replace advice given to you by your health care provider. Make sure you discuss any questions you have with your health care provider. °  °Document Released: 08/27/2004 Document Revised: 08/10/2014 Document Reviewed: 03/02/2013 °Elsevier Interactive Patient Education ©2016 [employer] ° °

## 2015-10-28 ENCOUNTER — Encounter: Payer: Self-pay | Admitting: Family Medicine

## 2015-10-28 ENCOUNTER — Ambulatory Visit (INDEPENDENT_AMBULATORY_CARE_PROVIDER_SITE_OTHER): Admitting: Family Medicine

## 2015-10-28 VITALS — BP 120/72 | HR 78 | Temp 97.9°F | Wt 198.0 lb

## 2015-10-28 DIAGNOSIS — S022XXA Fracture of nasal bones, initial encounter for closed fracture: Secondary | ICD-10-CM

## 2015-10-28 DIAGNOSIS — M79601 Pain in right arm: Secondary | ICD-10-CM | POA: Diagnosis not present

## 2015-10-28 DIAGNOSIS — M79602 Pain in left arm: Secondary | ICD-10-CM | POA: Diagnosis not present

## 2015-10-28 DIAGNOSIS — W19XXXA Unspecified fall, initial encounter: Secondary | ICD-10-CM | POA: Diagnosis not present

## 2015-10-28 MED ORDER — OXYCODONE-ACETAMINOPHEN 5-325 MG PO TABS: 1.0000 | ORAL_TABLET | ORAL | Status: DC | PRN

## 2015-10-28 NOTE — Patient Instructions (Signed)
Let's keep an eye on the radicular symptoms. If they are still seeing you in 2-3 weeks let's follow up. Could consider orthopedic evaluation or we could at least start with films and trial of prednisone  Given #10 percocet- I hope your pain continues to improve and you do not need all of them but let me know if you run out.

## 2015-10-28 NOTE — Progress Notes (Signed)
Garret Reddish, MD  Subjective:  Amber Jacobs is a 60 y.o. year old very pleasant female patient who presents for/with See problem oriented charting ROS- does have facial pain. No headache or blurry vision. No chest pain or shortness of breath. No fever or chills or nausea or vomiting.   Past Medical History-  Patient Active Problem List   Diagnosis Date Noted  . CKD (chronic kidney disease), stage III 08/07/2014    Priority: Medium  . Hyperglycemia 08/07/2014    Priority: Medium  . Hyperlipidemia 04/02/2008    Priority: Medium  . Tendon rupture, nontraumatic, extensor 03/21/2015    Priority: Low  . Hormone replacement therapy 11/01/2014    Priority: Low  . Stress at work 08/07/2014    Priority: Low  . Injury of toenail 06/13/2014    Priority: Low  . Hematuria 05/02/2010    Priority: Low  . Strain of left pectoralis muscle 09/20/2015  . Chest pain 09/20/2015    Medications- reviewed and updated Current Outpatient Prescriptions  Medication Sig Dispense Refill  . cephALEXin (KEFLEX) 500 MG capsule Take 1 capsule (500 mg total) by mouth 3 (three) times daily. 21 capsule 0  . Multiple Vitamin (MULTIVITAMIN) tablet Take 1 tablet by mouth daily.    . Omega-3 1000 MG CAPS Take 2,000 mg by mouth every morning.     . sertraline (ZOLOFT) 50 MG tablet Take 1 tablet (50 mg total) by mouth daily. 30 tablet 0  . simvastatin (ZOCOR) 40 MG tablet One by mouth daily or as directed. 90 tablet 3  . oxyCODONE-acetaminophen (PERCOCET/ROXICET) 5-325 MG tablet Take 1 tablet by mouth every 4 (four) hours as needed for severe pain. 10 tablet 0   No current facility-administered medications for this visit.    Objective: BP 120/72 mmHg  Pulse 78  Temp(Src) 97.9 F (36.6 C)  Wt 198 lb (89.812 kg) Gen: NAD, resting comfortably Extensive bruising of face with no obvious signs of infection. No obvious deviation of septum CV: RRR no murmurs rubs or gallops Lungs: CTAB no crackles, wheeze,  rhonchi Ext: no edema. Knee with normal ROM and only tenderness over the abrasions.  Skin: warm, dry, some abrasions on left knee as well.  Neuro: grossly normal, moves all extremities  Assessment/Plan:  Fall, initial encounter Bilateral arm pain Nasal fracture S: Fell when walking up stairs and concrete step crumbled under foot. Went to ED. She was NEXUS negative- spine cleared. GCS 15. Head CT negative. CT maxillofacial showed comminuted nasal fracture. 7 sutures nose. 3 sutures forehead with both sets absorbable.Plastic surgeon called Dr. Iran Planas- closure in ED and antibiotics with plastics follow up monday. She saw plastics today and instructed nose would heal on its own- no intervention required. Has follow up in another few weeks. Tdap was given 08/07/14 and was not updated in ED.  Patient states her pain is improving but she did use #10 percocet already. This is primarily for aching in the face. Using some ibuprofen- has to be cautious with CKD. She also even in the ER had some bilateral hand burning left/right with associated tinging sensation. Left hand with burning/tingling sensation. Some stiffness in neck. Not dropping objects. Even water hurts it. Hurt right after the fall. C spine was cleared as above. No weakness. Improving slightly daily.   A/P: reassuring ED workup especially CT head. Reviewed ED course and imaging reports with patient. Under care of plastics for broken nose and healing of skin (no signs of infection and finishing keflex course).  I did provide her with additional #10 percocet. We discussed trial of prednisone or gabapentin for potential radiculopathy- she declines and would prefer to give it another 2-3 weeks. She will let me know if not continuing to improve. Consider above vs. Ortho consult.   Return precautions advised.   Meds ordered this encounter  Medications  . oxyCODONE-acetaminophen (PERCOCET/ROXICET) 5-325 MG tablet    Sig: Take 1 tablet by mouth every  4 (four) hours as needed for severe pain.    Dispense:  10 tablet    Refill:  0

## 2016-01-22 ENCOUNTER — Telehealth: Payer: Self-pay | Admitting: Family Medicine

## 2016-01-27 NOTE — Telephone Encounter (Signed)
Pt has scheduled CPE can sertraline Zoloft 50 mg be refilled 90 days.   Pharm: [facility]

## 2016-01-27 NOTE — Telephone Encounter (Signed)
Yes thanks, may fill until visit with me

## 2016-01-28 ENCOUNTER — Other Ambulatory Visit: Payer: Self-pay

## 2016-01-28 MED ORDER — SERTRALINE HCL 50 MG PO TABS: 50.0000 mg | ORAL_TABLET | Freq: Every day | ORAL | Status: DC

## 2016-01-28 NOTE — Telephone Encounter (Signed)
Prescription refilled. Printed and faxed. Patients next appointment is scheduled for October.

## 2016-04-30 ENCOUNTER — Other Ambulatory Visit (INDEPENDENT_AMBULATORY_CARE_PROVIDER_SITE_OTHER)

## 2016-04-30 DIAGNOSIS — R319 Hematuria, unspecified: Secondary | ICD-10-CM | POA: Diagnosis not present

## 2016-04-30 DIAGNOSIS — Z0000 Encounter for general adult medical examination without abnormal findings: Secondary | ICD-10-CM

## 2016-04-30 LAB — CBC WITH DIFFERENTIAL/PLATELET
Basophils Absolute: 0 10*3/uL (ref 0.0–0.1)
Basophils Relative: 0.6 % (ref 0.0–3.0)
EOS PCT: 3.6 % (ref 0.0–5.0)
Eosinophils Absolute: 0.2 10*3/uL (ref 0.0–0.7)
HCT: 39.5 % (ref 36.0–46.0)
Hemoglobin: 13.8 g/dL (ref 12.0–15.0)
LYMPHS ABS: 1.5 10*3/uL (ref 0.7–4.0)
Lymphocytes Relative: 31.7 % (ref 12.0–46.0)
MCHC: 34.9 g/dL (ref 30.0–36.0)
MCV: 87.3 fl (ref 78.0–100.0)
MONOS PCT: 6.9 % (ref 3.0–12.0)
Monocytes Absolute: 0.3 10*3/uL (ref 0.1–1.0)
NEUTROS ABS: 2.6 10*3/uL (ref 1.4–7.7)
NEUTROS PCT: 57.2 % (ref 43.0–77.0)
PLATELETS: 232 10*3/uL (ref 150.0–400.0)
RBC: 4.52 Mil/uL (ref 3.87–5.11)
RDW: 13.5 % (ref 11.5–15.5)
WBC: 4.6 10*3/uL (ref 4.0–10.5)

## 2016-04-30 LAB — LIPID PANEL
CHOLESTEROL: 198 mg/dL (ref 0–200)
HDL: 38.5 mg/dL — ABNORMAL LOW (ref >39.00)
LDL Cholesterol: 120 mg/dL — ABNORMAL HIGH (ref 0–99)
NonHDL: 159.99
Total CHOL/HDL Ratio: 5
Triglycerides: 198 mg/dL — ABNORMAL HIGH (ref 0.0–149.0)
VLDL: 39.6 mg/dL (ref 0.0–40.0)

## 2016-04-30 LAB — POC URINALSYSI DIPSTICK (AUTOMATED)
Bilirubin, UA: NEGATIVE
Glucose, UA: NEGATIVE
Ketones, UA: NEGATIVE
LEUKOCYTES UA: NEGATIVE
NITRITE UA: NEGATIVE
PH UA: 5.5
PROTEIN UA: NEGATIVE
Spec Grav, UA: 1.01
UROBILINOGEN UA: 0.2

## 2016-04-30 LAB — URINALYSIS, MICROSCOPIC ONLY: RBC / HPF: NONE SEEN (ref 0–?)

## 2016-04-30 LAB — HEPATIC FUNCTION PANEL
ALBUMIN: 4.3 g/dL (ref 3.5–5.2)
ALT: 20 U/L (ref 0–35)
AST: 18 U/L (ref 0–37)
Alkaline Phosphatase: 60 U/L (ref 39–117)
Bilirubin, Direct: 0.1 mg/dL (ref 0.0–0.3)
Total Bilirubin: 0.5 mg/dL (ref 0.2–1.2)
Total Protein: 6.8 g/dL (ref 6.0–8.3)

## 2016-04-30 LAB — TSH: TSH: 1.99 u[IU]/mL (ref 0.35–4.50)

## 2016-04-30 LAB — BASIC METABOLIC PANEL
BUN: 24 mg/dL — ABNORMAL HIGH (ref 6–23)
CO2: 26 meq/L (ref 19–32)
Calcium: 9 mg/dL (ref 8.4–10.5)
Chloride: 105 mEq/L (ref 96–112)
Creatinine, Ser: 1.08 mg/dL (ref 0.40–1.20)
GFR: 54.72 mL/min — ABNORMAL LOW (ref >60.00)
GLUCOSE: 105 mg/dL — AB (ref 70–99)
POTASSIUM: 4.1 meq/L (ref 3.5–5.1)
SODIUM: 142 meq/L (ref 135–145)

## 2016-05-07 ENCOUNTER — Encounter: Payer: Self-pay | Admitting: Family Medicine

## 2016-05-07 ENCOUNTER — Ambulatory Visit (INDEPENDENT_AMBULATORY_CARE_PROVIDER_SITE_OTHER): Admitting: Family Medicine

## 2016-05-07 ENCOUNTER — Encounter: Payer: Self-pay | Admitting: Gastroenterology

## 2016-05-07 VITALS — BP 116/80 | HR 84 | Temp 97.8°F | Ht 66.5 in | Wt 198.0 lb

## 2016-05-07 DIAGNOSIS — A6000 Herpesviral infection of urogenital system, unspecified: Secondary | ICD-10-CM | POA: Insufficient documentation

## 2016-05-07 DIAGNOSIS — Z0000 Encounter for general adult medical examination without abnormal findings: Secondary | ICD-10-CM | POA: Diagnosis not present

## 2016-05-07 DIAGNOSIS — Z86010 Personal history of colonic polyps: Secondary | ICD-10-CM

## 2016-05-07 DIAGNOSIS — Z23 Encounter for immunization: Secondary | ICD-10-CM

## 2016-05-07 MED ORDER — ACYCLOVIR 200 MG PO CAPS: 200.0000 mg | ORAL_CAPSULE | Freq: Every day | ORAL | 3 refills | Status: DC

## 2016-05-07 NOTE — Patient Instructions (Addendum)
Sign release of information at the check out desk for records for mammogram and last pap smear  Declined HIV and Hep C testing  We will call you within a week about your referral to GI. If you do not hear within 2 weeks, give [place] a call.   Refilled acyclovir  Cholesterol looks much better- instead of increasing medicine- lets try to get to 189 and if LDL not under 100 next year consider twice a week dosing if you could tolerate it.

## 2016-05-07 NOTE — Progress Notes (Signed)
Phone: [phone]  Subjective:  Patient presents today for their annual physical. Chief complaint-noted.   See problem oriented charting- ROS- full  review of systems was completed and negative including No chest pain or shortness of breath. No headache or blurry vision.   The following were reviewed and entered/updated in epic: Past Medical History:  Diagnosis Date  . Hyperlipidemia    Patient Active Problem List   Diagnosis Date Noted  . Genital herpes 05/07/2016    Priority: Medium  . CKD (chronic kidney disease), stage III 08/07/2014    Priority: Medium  . Hyperglycemia 08/07/2014    Priority: Medium  . Hyperlipidemia 04/02/2008    Priority: Medium  . Tendon rupture, nontraumatic, extensor 03/21/2015    Priority: Low  . Hormone replacement therapy 11/01/2014    Priority: Low  . Stress at work 08/07/2014    Priority: Low  . Injury of toenail 06/13/2014    Priority: Low  . Hematuria 05/02/2010    Priority: Low  . Strain of left pectoralis muscle 09/20/2015  . Chest pain 09/20/2015   Past Surgical History:  Procedure Laterality Date  . COSMETIC SURGERY     facial  . fibroid tumors    . KNEE ARTHROSCOPY    . TONSILLECTOMY AND ADENOIDECTOMY     as a child    Family History  Problem Relation Age of Onset  . Alcohol abuse Mother     Patient was adopted and not much else known    Medications- reviewed and updated Current Outpatient Prescriptions  Medication Sig Dispense Refill  . Multiple Vitamin (MULTIVITAMIN) tablet Take 1 tablet by mouth daily.    . Omega-3 1000 MG CAPS Take 2,000 mg by mouth every morning.     . sertraline (ZOLOFT) 50 MG tablet Take 1 tablet (50 mg total) by mouth daily. 90 tablet 1  . simvastatin (ZOCOR) 40 MG tablet One by mouth daily or as directed. 90 tablet 3  . acyclovir (ZOVIRAX) 200 MG capsule Take 1 capsule (200 mg total) by mouth 5 (five) times daily. 25 capsule 3   No current facility-administered medications for this visit.      Allergies-reviewed and updated Allergies  Allergen Reactions  . Fentanyl Nausea Only  . Zocor [Simvastatin] Diarrhea    Muscle aches and pains    Social History   Social History  . Marital status: Married    Spouse name: N/A  . Number of children: N/A  . Years of education: N/A   Social History Main Topics  . Smoking status: Never Smoker  . Smokeless tobacco: None  . Alcohol use 1.2 oz/week    2 Glasses of wine per week  . Drug use:      Comment: occasional marijuana perhaps once a month-goes to [place]  . Sexual activity: Not Asked   Other Topics Concern  . None   Social History Narrative   Married 2006 (together since 1984). No children-1 aborted pregnancy. Many pets (at least 20 legs at any given time)      Works For [employer]. 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 Oropharynx normal Neck: no thyromegaly CV: RRR no murmurs rubs or gallops Lungs: CTAB no crackles, wheeze, rhonchi Abdomen: soft/nontender/nondistended/normal bowel sounds. No rebound or guarding.  Ext: no edema Skin: warm, dry Neuro: grossly normal, moves all extremities, PERRLA  Assessment/Plan:  60 y.o. female presenting for annual physical.  Health Maintenance counseling: 1. Anticipatory guidance: Patient counseled regarding regular dental exams, eye exams.  2. Risk factor reduction:  Advised patient of need for regular exercise and diet rich and fruits and vegetables to reduce risk of heart attack and stroke. Lost 6 lbs in 2 weeks cutting sugar Wt Readings from Last 3 Encounters:  05/07/16 198 lb (89.8 kg)  10/28/15 198 lb (89.8 kg)    09/20/15 199 lb (90.3 kg)  3. Immunizations/screenings/ancillary studies Immunization History  Administered Date(s) Administered  . Influenza Split 04/22/2012  . Influenza,inj,Quad PF,36+ Mos 04/25/2013, 05/28/2014, 04/05/2015, 05/07/2016  . Td 08/04/2003  . Tdap 08/07/2014   Health Maintenance Due  Topic Date Due  . Hepatitis C Screening - declined 1956-03-17  . HIV Screening - declined 10/22/1969  . ZOSTAVAX - wants to revisit next year 10/23/2014   4. Cervical cancer screening- 07/02/14 [facility] with 3 year follow up 5. Breast cancer screening-  breast exam with GYN  and mammogram 07/02/14 normal 6. Colon cancer screening - 03/12/2010 with 5 year repeat due to adenoma will refer at this time 7. Skin cancer screening- Dr. Allyson Sabal yearly  Status of chronic or acute concerns  Hyperglycemia- discussed weight loss Lab Results  Component Value Date   HGBA1C 6.0 08/07/2014   Hyperlipidemia- takes simvastatin intermittently usually once a week- myalgias intensify with more frequent use. We discussed working on weigh tloss as w way to help further.  Lab Results  Component Value Date   CHOL 198 04/30/2016   HDL 38.50 (L) 04/30/2016   LDLCALC 120 (H) 04/30/2016   LDLDIRECT 161.7 08/07/2014   TRIG 198.0 (H) 04/30/2016   CHOLHDL 5 04/30/2016   CKD III- has declined ace-I in past. No HTN. Avoiding nsaids  Stress at work- on zoloft 50mg , controls anxiety reasonably well  Genital herpes acycylovir for flares about once a year- 200mg  5x a day for 5 days. Previous by GYN- asked for me to fill today and I agreed  Orders Placed This Encounter  Procedures  . Flu Vaccine QUAD 36+ mos IM  . Ambulatory referral to Gastroenterology    Referral Priority:   Routine    Referral Type:   Consultation    Referral Reason:   Specialty Services Required    Number of Visits Requested:   1  03/12/2010 with 5 year repeat due to adenoma will refer at this time   Meds ordered this encounter   Medications  . acyclovir (ZOVIRAX) 200 MG capsule    Sig: Take 1 capsule (200 mg total) by mouth 5 (five) times daily.    Dispense:  25 capsule    Refill:  3    Return precautions advised.   Garret Reddish, MD

## 2016-05-07 NOTE — Assessment & Plan Note (Signed)
acycylovir for flares about once a year- 200mg  5x a day for 5 days. Previous by GYN- asked for me to fill today and I agreed

## 2016-05-07 NOTE — Progress Notes (Signed)
Pre visit review using our clinic review tool, if applicable. No additional management support is needed unless otherwise documented below in the visit note. 

## 2016-05-22 ENCOUNTER — Encounter: Payer: Self-pay | Admitting: Family Medicine

## 2016-06-18 ENCOUNTER — Ambulatory Visit (AMBULATORY_SURGERY_CENTER): Payer: Self-pay | Admitting: *Deleted

## 2016-06-18 VITALS — Ht 67.0 in | Wt 202.0 lb

## 2016-06-18 DIAGNOSIS — Z86010 Personal history of colonic polyps: Secondary | ICD-10-CM

## 2016-06-18 MED ORDER — NA SULFATE-K SULFATE-MG SULF 17.5-3.13-1.6 GM/177ML PO SOLN: 1.0000 | Freq: Once | ORAL | 0 refills | Status: AC

## 2016-06-18 NOTE — Progress Notes (Signed)
No egg or soy allergy. No anesthesia problems.  No home O2.  No diet meds.  

## 2016-07-03 ENCOUNTER — Ambulatory Visit (AMBULATORY_SURGERY_CENTER): Admitting: Gastroenterology

## 2016-07-03 ENCOUNTER — Encounter: Payer: Self-pay | Admitting: Gastroenterology

## 2016-07-03 VITALS — BP 111/56 | HR 69 | Temp 97.8°F | Resp 16 | Ht 67.0 in | Wt 202.0 lb

## 2016-07-03 DIAGNOSIS — Z86010 Personal history of colonic polyps: Secondary | ICD-10-CM | POA: Diagnosis present

## 2016-07-03 DIAGNOSIS — K635 Polyp of colon: Secondary | ICD-10-CM | POA: Diagnosis not present

## 2016-07-03 DIAGNOSIS — D124 Benign neoplasm of descending colon: Secondary | ICD-10-CM

## 2016-07-03 DIAGNOSIS — K5730 Diverticulosis of large intestine without perforation or abscess without bleeding: Secondary | ICD-10-CM | POA: Diagnosis not present

## 2016-07-03 LAB — HM COLONOSCOPY

## 2016-07-03 MED ORDER — SODIUM CHLORIDE 0.9 % IV SOLN: 500.0000 mL | INTRAVENOUS | Status: DC

## 2016-07-03 NOTE — Progress Notes (Signed)
To recovery, report to Mir[place], RN, VSS. 

## 2016-07-03 NOTE — Op Note (Signed)
[facility] Patient Name: Amber Jacobs Procedure Date: 07/03/2016 8:08 AM MRN: [id number] Endoscopist: Milus Banister , MD Age: 60 Referring MD:  Date of Birth: [birth year] Gender: Female Account #: [id number] Procedure:                Colonoscopy Indications:              High risk colon cancer surveillance: Personal                            history of colonic polyps; colonoscopy Dr. Ardis Hughs                            2011 found one subCM tubular adenoma Medicines:                Monitored Anesthesia Care Procedure:                Pre-Anesthesia Assessment:                           - Prior to the procedure, a History and Physical                            was performed, and patient medications and                            allergies were reviewed. The patient's tolerance of                            previous anesthesia was also reviewed. The risks                            and benefits of the procedure and the sedation                            options and risks were discussed with the patient.                            All questions were answered, and informed consent                            was obtained. Prior Anticoagulants: The patient has                            taken no previous anticoagulant or antiplatelet                            agents. ASA Grade Assessment: II - A patient with                            mild systemic disease. After reviewing the risks                            and benefits, the patient was deemed in  satisfactory condition to undergo the procedure.                           After obtaining informed consent, the colonoscope                            was passed under direct vision. Throughout the                            procedure, the patient's blood pressure, pulse, and                            oxygen saturations were monitored continuously. The                            Model CF-HQ190L  [id number]) scope was introduced                            through the anus and advanced to the the cecum,                            identified by appendiceal orifice and ileocecal                            valve. The colonoscopy was performed without                            difficulty. The patient tolerated the procedure                            well. The quality of the bowel preparation was                            excellent. The ileocecal valve, appendiceal                            orifice, and rectum were photographed. Scope In: 8:11:50 AM Scope Out: 8:19:57 AM Scope Withdrawal Time: 0 hours 6 minutes 15 seconds  Total Procedure Duration: 0 hours 8 minutes 7 seconds  Findings:                 A 3 mm polyp was found in the descending colon. The                            polyp was sessile. The polyp was removed with a                            cold snare. Resection and retrieval were complete.                           Mild left sided diverticulosis.                           The exam was otherwise without abnormality on  direct and retroflexion views. Complications:            No immediate complications. Estimated blood loss:                            None. Estimated Blood Loss:     Estimated blood loss: none. Impression:               - One 3 mm polyp in the descending colon, removed                            with a cold snare. Resected and retrieved.                           - Mild left sided diverticulosis                           - The examination was otherwise normal on direct                            and retroflexion views. Recommendation:           - Patient has a contact number available for                            emergencies. The signs and symptoms of potential                            delayed complications were discussed with the                            patient. Return to normal activities tomorrow.                             Written discharge instructions were provided to the                            patient.                           - Resume previous diet.                           - Continue present medications.                           You will receive a letter within 2-3 weeks with the                            pathology results and my final recommendations.                           If the polyp(s) is proven to be 'pre-cancerous' on                            pathology, you will need repeat colonoscopy in 5  years. If the polyp(s) is NOT 'precancerous' on                            pathology then you should repeat colon cancer                            screening in 10 years with colonoscopy without need                            for colon cancer screening by any method prior to                            then (including stool testing). Milus Banister, MD 07/03/2016 8:22:18 AM This report has been signed electronically.

## 2016-07-03 NOTE — Progress Notes (Signed)
Called to room to assist during endoscopic procedure.  Patient ID and intended procedure confirmed with present staff. Received instructions for my participation in the procedure from the performing physician.  

## 2016-07-03 NOTE — Patient Instructions (Signed)
YOU HAD AN ENDOSCOPIC PROCEDURE TODAY AT [facility]:   Refer to the procedure report that was given to you for any specific questions about what was found during the examination.  If the procedure report does not answer your questions, please call your gastroenterologist to clarify.  If you requested that your care partner not be given the details of your procedure findings, then the procedure report has been included in a sealed envelope for you to review at your convenience later.  YOU SHOULD EXPECT: Some feelings of bloating in the abdomen. Passage of more gas than usual.  Walking can help get rid of the air that was put into your GI tract during the procedure and reduce the bloating. If you had a lower endoscopy (such as a colonoscopy or flexible sigmoidoscopy) you may notice spotting of blood in your stool or on the toilet paper. If you underwent a bowel prep for your procedure, you may not have a normal bowel movement for a few days.  Please Note:  You might notice some irritation and congestion in your nose or some drainage.  This is from the oxygen used during your procedure.  There is no need for concern and it should clear up in a day or so.  SYMPTOMS TO REPORT IMMEDIATELY:   Following lower endoscopy (colonoscopy or flexible sigmoidoscopy):  Excessive amounts of blood in the stool  Significant tenderness or worsening of abdominal pains  Swelling of the abdomen that is new, acute  Fever of 100F or higher   For urgent or emergent issues, a gastroenterologist can be reached at any hour by calling [phone].   DIET:  We do recommend a small meal at first, but then you may proceed to your regular diet.  Drink plenty of fluids but you should avoid alcoholic beverages for 24 hours.  ACTIVITY:  You should plan to take it easy for the rest of today and you should NOT DRIVE or use heavy machinery until tomorrow (because of the sedation medicines used during the test).     FOLLOW UP: Our staff will call the number listed on your records the next business day following your procedure to check on you and address any questions or concerns that you may have regarding the information given to you following your procedure. If we do not reach you, we will leave a message.  However, if you are feeling well and you are not experiencing any problems, there is no need to return our call.  We will assume that you have returned to your regular daily activities without incident.  If any biopsies were taken you will be contacted by phone or by letter within the next 1-3 weeks.  Please call us at [phone] if you have not heard about the biopsies in 3 weeks.    SIGNATURES/CONFIDENTIALITY: You and/or your care partner have signed paperwork which will be entered into your electronic medical record.  These signatures attest to the fact that that the information above on your After Visit Summary has been reviewed and is understood.  Full responsibility of the confidentiality of this discharge information lies with you and/or your care-partner.  Polyp-handout given  Repeat colonoscopy will be determined by pathology.

## 2016-07-03 NOTE — Progress Notes (Signed)
Pt refuses to pass gas in recovery, pt states she is not bloated and does not have any gas, explained to pt that we put a lot of air in the colon during the procedure and it can be painful if it gets trapped, pt verbalizes understanding, still denies feeling like she has any gas, pt abd is soft and non distended, pt appears to be in no distress, pt is discharged home after discharge instructions given-adm

## 2016-07-06 ENCOUNTER — Telehealth: Payer: Self-pay | Admitting: *Deleted

## 2016-07-06 NOTE — Telephone Encounter (Signed)
  Follow up Call-  Call back number 07/03/2016  Post procedure Call Back phone  # [phone]  Permission to leave phone message Yes  Some recent data might be hidden     Patient questions:  Do you have a fever, pain , or abdominal swelling? No. Pain Score  0 *  Have you tolerated food without any problems? Yes.    Have you been able to return to your normal activities? Yes.    Do you have any questions about your discharge instructions: Diet   No. Medications  No. Follow up visit  No.  Do you have questions or concerns about your Care? No.  Actions: * If pain score is 4 or above: No action needed, pain <4.

## 2016-07-13 ENCOUNTER — Encounter: Payer: Self-pay | Admitting: Gastroenterology

## 2016-07-26 ENCOUNTER — Other Ambulatory Visit: Payer: Self-pay | Admitting: Family Medicine

## 2016-09-25 ENCOUNTER — Ambulatory Visit (INDEPENDENT_AMBULATORY_CARE_PROVIDER_SITE_OTHER): Admitting: Family Medicine

## 2016-09-25 ENCOUNTER — Encounter: Payer: Self-pay | Admitting: Family Medicine

## 2016-09-25 ENCOUNTER — Other Ambulatory Visit: Payer: Self-pay | Admitting: Family Medicine

## 2016-09-25 ENCOUNTER — Other Ambulatory Visit: Payer: Self-pay

## 2016-09-25 VITALS — BP 122/88 | HR 78 | Temp 98.1°F | Ht 67.0 in | Wt 201.8 lb

## 2016-09-25 DIAGNOSIS — L659 Nonscarring hair loss, unspecified: Secondary | ICD-10-CM

## 2016-09-25 LAB — TSH: TSH: 1.25 u[IU]/mL (ref 0.35–4.50)

## 2016-09-25 MED ORDER — SERTRALINE HCL 50 MG PO TABS: ORAL_TABLET | ORAL | 1 refills | Status: DC

## 2016-09-25 NOTE — Patient Instructions (Addendum)
Please stop by lab before you go- evaluate thyroid and possible lyme  I would follow up with your dermatologist. There are some recommendations to consider injections for alopecia areata (altough evidence not super strong). I also want their opinion in case they think something else is going on  Alopecia Areata Alopecia areata is a type of hair loss. If you have this condition, you may lose hair on your scalp in patches. In some cases, you may lose all the hair on your scalp (alopecia totalis) or all the hair from your face and body (alopecia universalis).  Alopecia areata is an autoimmune disease. This means your body's defense system (immune system) mistakes normal parts of your body for germs or other things that can make you sick. When you have alopecia areata, your immune system attacks your hair follicles.  Alopecia areata often starts during childhood but can occur at any age. Alopecia areata is not a danger to your health but can be stressful.  CAUSES  The cause of alopecia areata is unknown.  RISK FACTORS You may be at higher risk of alopecia areata if you:   Have a family history of alopecia.  Have a family history of another autoimmune disease, including type 1 diabetes and rheumatoid arthritis. SIGNS AND SYMPTOMS Signs of alopecia areata may include:  Loss of scalp hair in small, round patches. These may be about the size of a quarter.  Loss of all hair on your scalp.  Loss of eyebrow hair, facial hair, or the hair inside your nose (nasal hair).  Hair loss over your entire body. DIAGNOSIS  Alopecia areata may be diagnosed by:  Medical history and physical exam.  Taking a sample of hair to check under a microscope.  Taking a small piece of skin (biopsy) to examine under a microscope.  Blood tests to rule out other autoimmune diseases. TREATMENT  There is no cure for alopecia areata, but the disease often goes away over time. You will not lose the ability to regrow  hair. Some medicines may help your hair regrow more quickly. These include:  Corticosteroids. These block inflammation caused by your immune system. You may get this medicine as a lotion for your skin or as an injection.  Minoxidil. This is a hair growth medicine you can use in areas of hair loss.  Anthralin. This is a medicine for a skin inflammation called psoriasis that may also help alopecia.  Diphencyprone. This medicine is applied to your skin and may stimulate hair growth. HOME CARE INSTRUCTIONS  Use sunscreen or cover your head when outdoors.  Take medicines only as directed by your health care provider.  If you have lost your eyebrows, wear sunglasses outside to keep dust out of your eyes.  If you have lost hair inside your nose, wear a kerchief over your face or apply ointment to the inside of your nose. This keeps out dust and other irritants.  Keep all follow-up visits as directed by your health care provider. This is important. SEEK MEDICAL CARE IF:  Your symptoms change.  You have new symptoms.  You have a reaction to your medicines.  You are struggling emotionally. This information is not intended to replace advice given to you by your health care provider. Make sure you discuss any questions you have with your health care provider. Document Released: 02/22/2004 Document Revised: 08/10/2014 Document Reviewed: 10/09/2013 Elsevier Interactive Patient Education  2017 [employer].

## 2016-09-25 NOTE — Progress Notes (Signed)
Pre visit review using our clinic review tool, if applicable. No additional management support is needed unless otherwise documented below in the visit note. 

## 2016-09-25 NOTE — Progress Notes (Signed)
Subjective:  Amber Jacobs is a 61 y.o. year old very pleasant female patient who presents for/with See problem oriented charting ROS- no fever, chills, rash in mouth, unintentional weight loss. Feels well overall.    Past Medical History-  Patient Active Problem List   Diagnosis Date Noted  . Genital herpes 05/07/2016    Priority: Medium  . CKD (chronic kidney disease), stage III 08/07/2014    Priority: Medium  . Hyperglycemia 08/07/2014    Priority: Medium  . Hyperlipidemia 04/02/2008    Priority: Medium  . Tendon rupture, nontraumatic, extensor 03/21/2015    Priority: Low  . Hormone replacement therapy 11/01/2014    Priority: Low  . Stress at work 08/07/2014    Priority: Low  . Injury of toenail 06/13/2014    Priority: Low  . Hematuria 05/02/2010    Priority: Low  . Strain of left pectoralis muscle 09/20/2015  . Chest pain 09/20/2015    Medications- reviewed and updated Current Outpatient Prescriptions  Medication Sig Dispense Refill  . acyclovir (ZOVIRAX) 200 MG capsule Take 1 capsule (200 mg total) by mouth 5 (five) times daily. 25 capsule 3  . Multiple Vitamin (MULTIVITAMIN) tablet Take 1 tablet by mouth daily.    . Omega-3 1000 MG CAPS Take 2,000 mg by mouth every morning.     . simvastatin (ZOCOR) 40 MG tablet One by mouth daily or as directed. 90 tablet 3  . sertraline (ZOLOFT) 50 MG tablet TAKE 1 TABLET DAILY 90 tablet 1   No current facility-administered medications for this visit.     Objective: BP 122/88 (BP Location: Left Arm, Patient Position: Sitting, Cuff Size: Normal)   Pulse 78   Temp 98.1 F (36.7 C) (Oral)   Ht 5\' 7"  (1.702 m)   Wt 201 lb 12.8 oz (91.5 kg)   SpO2 95%   BMI 31.61 kg/m  Gen: NAD, resting comfortably No thyromegaly CV: RRR no murmurs rubs or gallops Lungs: CTAB no crackles, wheeze, rhonchi Ext: no edema Skin: warm, dry Bilateral temples with 3.5 x 3.5 CM oval shaped areas of hair loss. Skin without scarring. May even be  hypopigmented  Assessment/Plan:  Hair loss - Plan: B. Burgdorfi Antibodies, TSH S: Patient states she first Noted hair loss around January 4th. Her hair was being done by hair dresser who noted this. Perhaps mildly worse. Never had anything like this before. Has lost patches of hair in bilateral  Temporal areas in oval shape.   Feels like area is smooth. She denies pain. No rash or scaling in these areas.   She is concerned- [employer] [facility] where she lives. Several tick bites in the past. 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 As new onset in 55s doubt triangular temporal hair loss.   I do not think lyme or thyroid are likely culprits but agreed to lab testing at patient request. Does not appear to be tinea  In addition, I did advise her to see her dermatologist Dr. Allyson Sabal- I wonder if this could be a specific pattern other than alopecia areata. In addition if it is- wonder if she could benefit from steroid injections.   Orders Placed This Encounter  Procedures  . B. Burgdorfi Antibodies  . TSH    [place]   Return precautions advised.  Garret Reddish, MD

## 2016-09-28 LAB — LYME AB/WESTERN BLOT REFLEX: B burgdorferi Ab IgG+IgM: <0.9 Index (ref <0.90)

## 2017-04-20 ENCOUNTER — Encounter: Payer: Self-pay | Admitting: Adult Health

## 2017-04-20 ENCOUNTER — Ambulatory Visit (INDEPENDENT_AMBULATORY_CARE_PROVIDER_SITE_OTHER): Admitting: Adult Health

## 2017-04-20 VITALS — BP 116/76 | Temp 98.0°F | Wt 202.0 lb

## 2017-04-20 DIAGNOSIS — S61532A Puncture wound without foreign body of left wrist, initial encounter: Secondary | ICD-10-CM | POA: Diagnosis not present

## 2017-04-20 DIAGNOSIS — W5501XA Bitten by cat, initial encounter: Secondary | ICD-10-CM | POA: Diagnosis not present

## 2017-04-20 MED ORDER — AMOXICILLIN-POT CLAVULANATE 875-125 MG PO TABS: 1.0000 | ORAL_TABLET | Freq: Two times a day (BID) | ORAL | 0 refills | Status: DC

## 2017-04-20 NOTE — Progress Notes (Signed)
Subjective:    Patient ID: Amber Jacobs, female    DOB: Dec 27, 1955, 61 y.o.   MRN: [id number]  Patient reports that her cat had caught a mouse in her home. When she went to grab her cat, her cat bit her left forearm and wrist and scratched her left forearm and left leg. Her cat is UTD on vaccinations    Animal Bite   The incident occurred today. The incident occurred at home. She came to the ER via personal transport. There is an injury to the left forearm and left wrist. There is an injury to the left lower leg. The pain is mild. It is unlikely that a foreign body is present. There have been prior injuries to these areas. Her tetanus status is UTD. She has been behaving normally. There were no sick contacts. She has received no recent medical care. Services received include medications given.   Review of Systems See HPI   Past Medical History:  Diagnosis Date  . Cataract   . Chronic kidney disease   . Depression    stress  . Heart murmur    born with  . Hyperlipidemia     Social History   Social History  . Marital status: Married    Spouse name: N/A  . Number of children: N/A  . Years of education: N/A   Occupational History  . Not on file.   Social History Main Topics  . Smoking status: Never Smoker  . Smokeless tobacco: Never Used  . Alcohol use 1.2 oz/week    2 Glasses of wine per week  . Drug use: Yes     Comment: occasional marijuana perhaps once a month-goes to [place]  . Sexual activity: Not on file   Other Topics Concern  . Not on file   Social History Narrative   Married 2006 (together since 1984). No children-1 aborted pregnancy. Many pets (at least 20 legs at any given time)      Works For [employer]. 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 COLONOSCOPY    . COSMETIC SURGERY     facial  . fibroid tumors    . KNEE  ARTHROSCOPY    . TONSILLECTOMY AND ADENOIDECTOMY     as a child  . WISDOM TOOTH EXTRACTION      Family History  Problem Relation Age of Onset  . Adopted: Yes  . Alcohol abuse Mother        Patient was adopted and not much else known  . Colon cancer Neg Hx     Allergies  Allergen Reactions  . Fentanyl Nausea Only  . Zocor [Simvastatin] Diarrhea    Muscle aches and pains    Current Outpatient Prescriptions on File Prior to Visit  Medication Sig Dispense Refill  . acyclovir (ZOVIRAX) 200 MG capsule Take 1 capsule (200 mg total) by mouth 5 (five) times daily. 25 capsule 3  . Multiple Vitamin (MULTIVITAMIN) tablet Take 1 tablet by mouth daily.    . Omega-3 1000 MG CAPS Take 2,000 mg by mouth every morning.     . sertraline (ZOLOFT) 50 MG tablet TAKE 1 TABLET DAILY 90 tablet 1  . simvastatin (ZOCOR) 40 MG tablet One by mouth daily or as directed. 90 tablet 3   No current facility-administered medications on file prior to visit.     BP  116/76 (BP Location: Right Arm)   Temp 98 F (36.7 C) (Oral)   Wt 202 lb (91.6 kg)   BMI 31.64 kg/m       Objective:   Physical Exam  Constitutional: She is oriented to person, place, and time. She appears well-developed and well-nourished. No distress.  Cardiovascular: Normal rate, regular rhythm, normal heart sounds and intact distal pulses.  Exam reveals no gallop and no friction rub.   No murmur heard. Pulmonary/Chest: Effort normal and breath sounds normal. No respiratory distress. She has no wheezes. She has no rales. She exhibits no tenderness.  Neurological: She is alert and oriented to person, place, and time.  Skin: Skin is warm and dry. She is not diaphoretic.  Multiple superficial scratch marks on left forearm and left leg.   She has a wound from bite on left ventral wrist and two bite marks on left medial wrist. All bleeding controlled.   Psychiatric: She has a normal mood and affect. Her behavior is normal. Judgment and  thought content normal.  Nursing note and vitals reviewed.     Assessment & Plan:  1. Cat bite, initial encounter - Wounds cleaned and bandaged. Will prescribe Augmentin due to puncture wounds. Advised to keep wounds clean and dry. Follow up with any signs of infection.  - amoxicillin-clavulanate (AUGMENTIN) 875-125 MG tablet; Take 1 tablet by mouth 2 (two) times daily.  Dispense: 14 tablet; Refill: 0  Dorothyann Peng, NP

## 2017-04-22 ENCOUNTER — Other Ambulatory Visit: Payer: Self-pay | Admitting: Family Medicine

## 2017-04-26 ENCOUNTER — Ambulatory Visit (INDEPENDENT_AMBULATORY_CARE_PROVIDER_SITE_OTHER)

## 2017-04-26 DIAGNOSIS — Z23 Encounter for immunization: Secondary | ICD-10-CM

## 2017-04-26 NOTE — Progress Notes (Signed)
A19.379K (Puncture wound without foreign body of left wrist, initial encounter) would be fine to use   Thanks you

## 2017-04-27 ENCOUNTER — Other Ambulatory Visit

## 2017-05-07 ENCOUNTER — Encounter: Admitting: Family Medicine

## 2017-05-16 IMAGING — DX DG KNEE COMPLETE 4+V*L*
4 series · 4 of 4 positions shown · non-contrast
Comparison: MRI left knee 09/04/2008

CLINICAL DATA: Fell today.  Left knee pain and abrasions.

EXAM:
LEFT KNEE - COMPLETE 4+ VIEW

[knee ap]
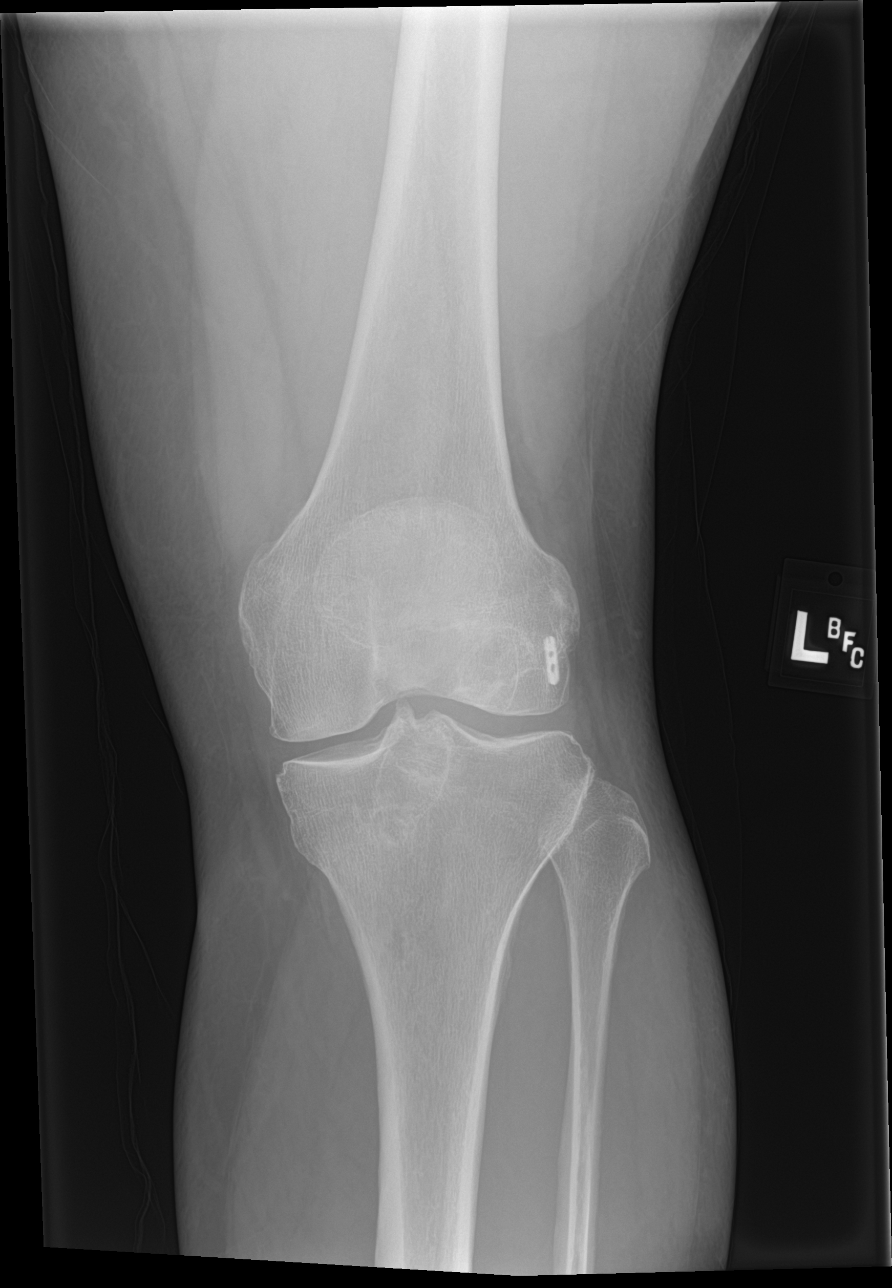

[knee lat]
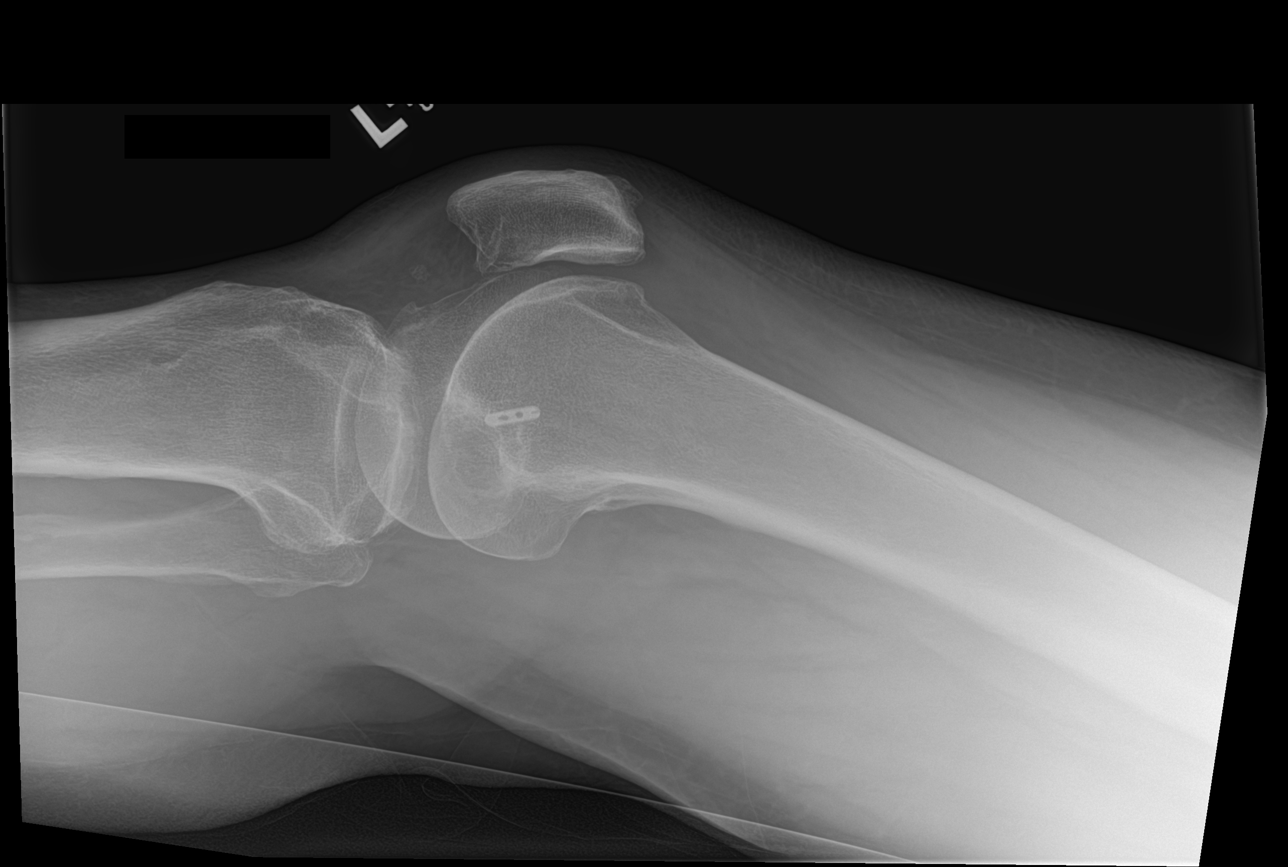

[knee obl (1 of 2)]
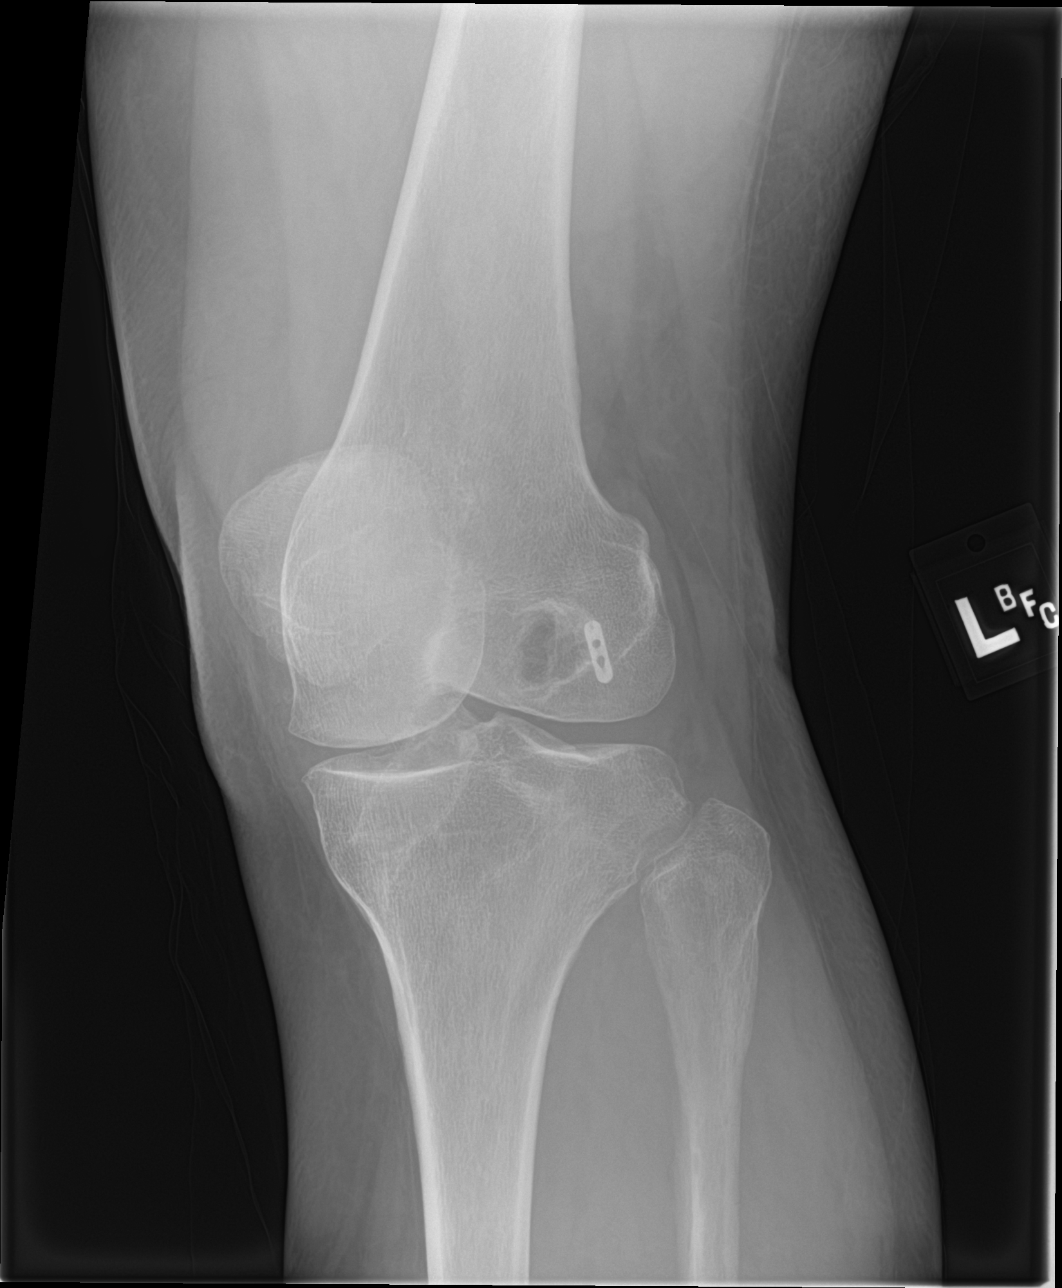

[knee obl (2 of 2)]
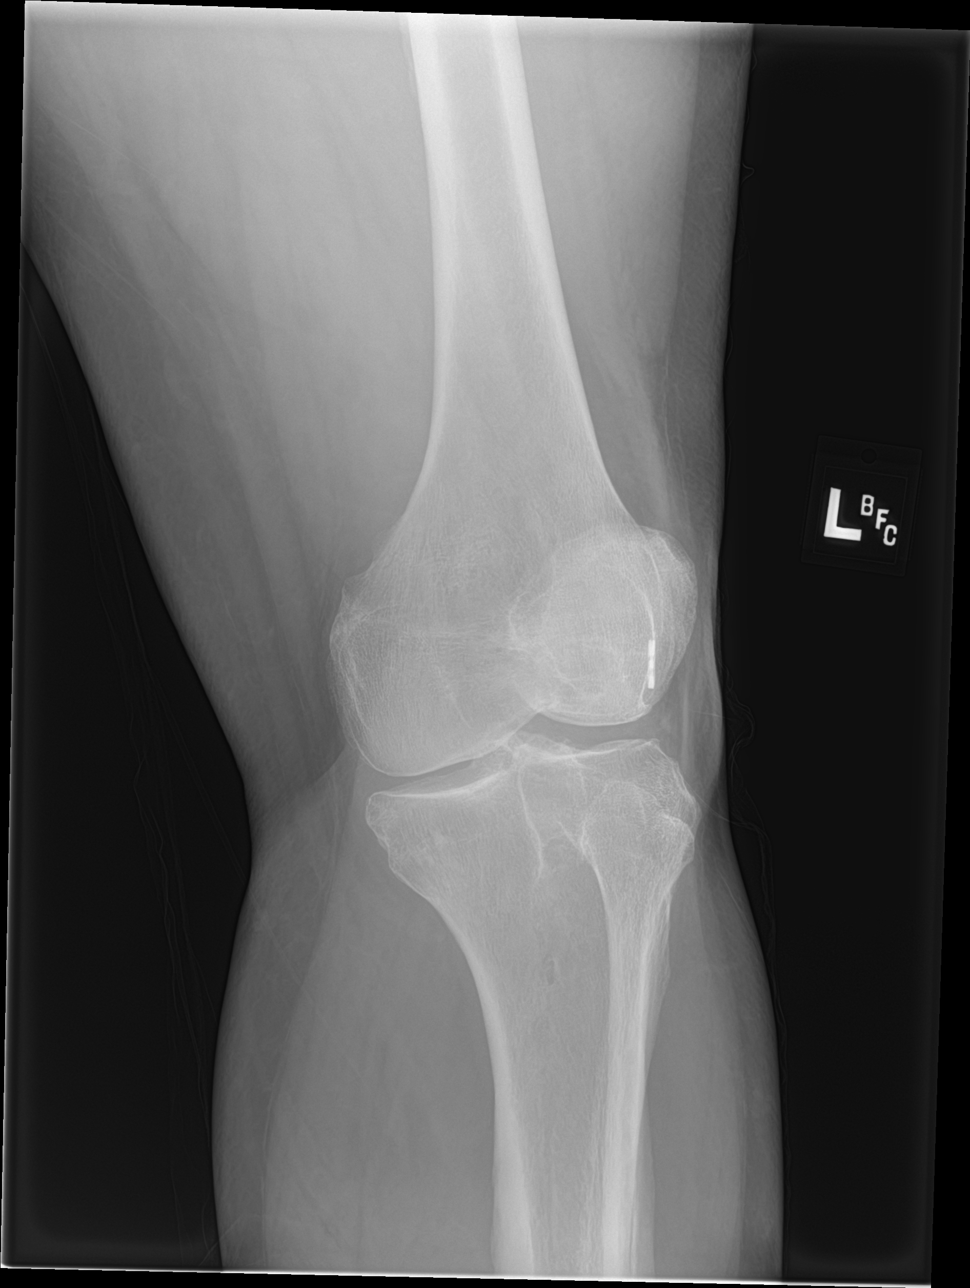

[4 of 4 positions shown; findings below may reference images not displayed]

FINDINGS: Postoperative changes in the left knee consistent with anterior
cruciate ligament repair. Mild degenerative changes with medial
compartment narrowing and small osteophyte formation. No evidence of
acute fracture or dislocation. No focal bone lesion or bone
destruction. No significant effusion.
IMPRESSION: Postoperative and mild degenerative changes in the left knee. No
acute bony abnormalities.

## 2017-05-16 IMAGING — CT CT HEAD W/O CM
2 of 3 series · 15 of 30 positions shown, 18 images · non-contrast
Comparison: None.

CLINICAL DATA: Patient fell and hit forehead on concrete.  Pain.

EXAM:
CT HEAD WITHOUT CONTRAST
CT MAXILLOFACIAL WITHOUT CONTRAST
TECHNIQUE: Multidetector CT imaging of the head and maxillofacial structures
were performed using the standard protocol without intravenous
contrast. Multiplanar CT image reconstructions of the maxillofacial
structures were also generated.

[Series 201: head w/o, idose (1) · axial · non-contrast · 0.43mm/px · z∈[+62,+152]mm · 4 of 32 slices shown]
[im 7/32  brain]
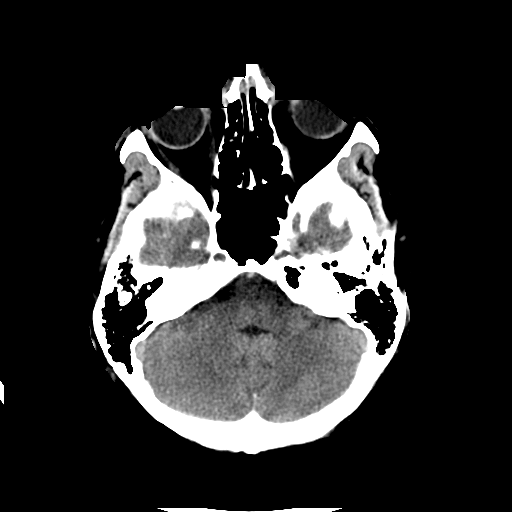
[im 13/32  brain]
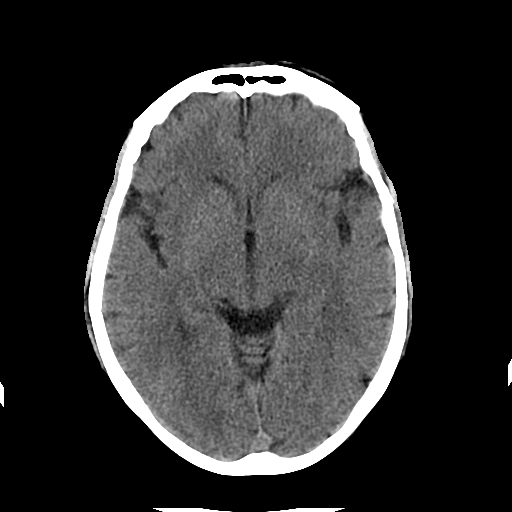
[im 19/32  brain]
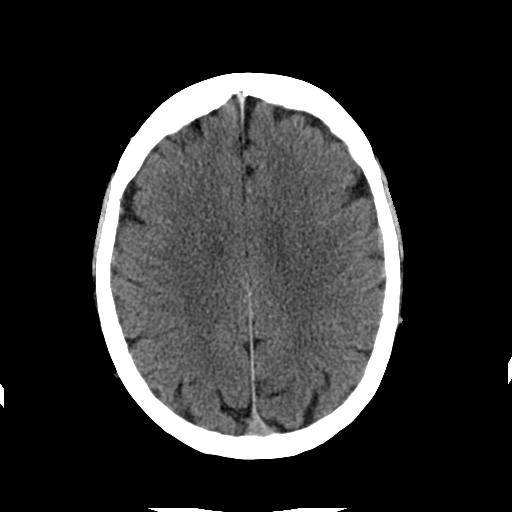
[im 25/32  brain]
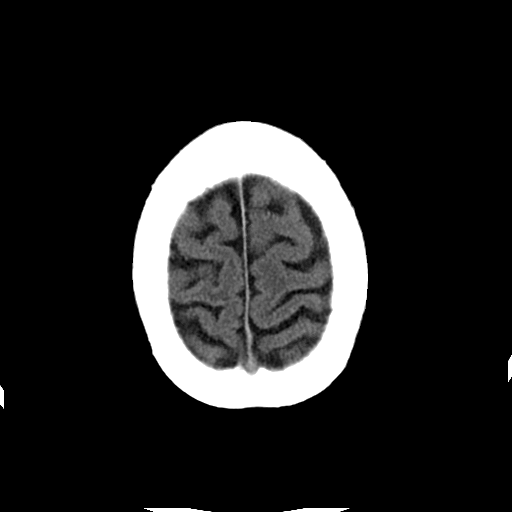

[Series 301: facial bones, idose (1) · axial · 0.38mm/px · z∈[-37,+91]mm · 11 of 78 slices shown, 14 images]
[im 7/78  brain]
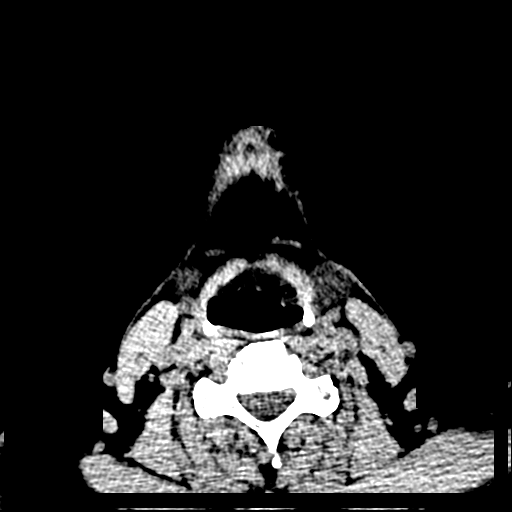
[im 7/78  bone]
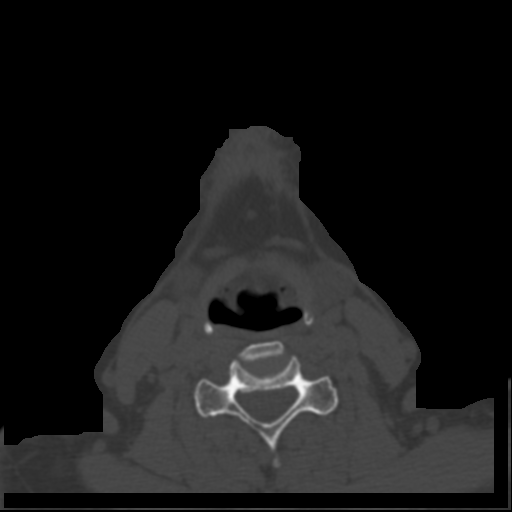
[im 13/78  brain]
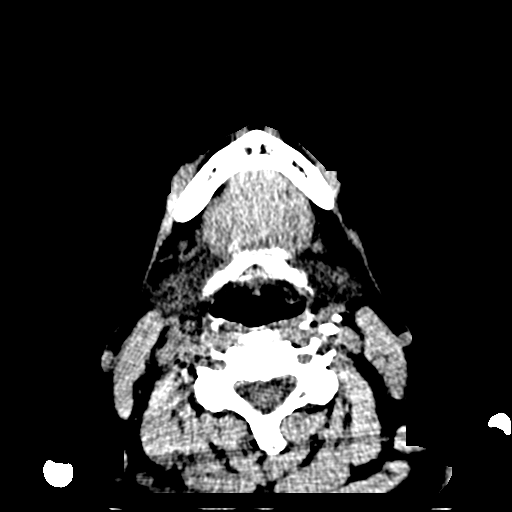
[im 20/78  brain]
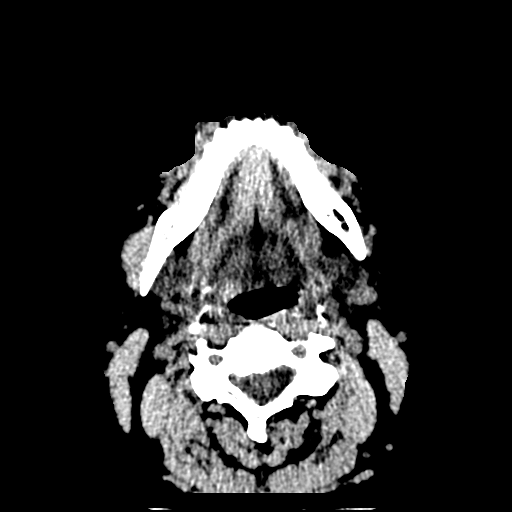
[im 26/78  brain]
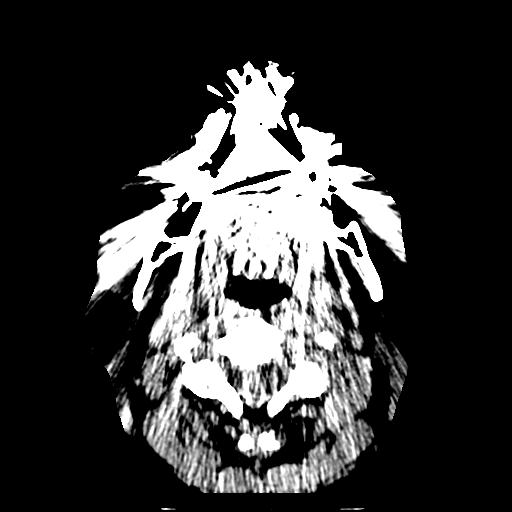
[im 33/78  brain]
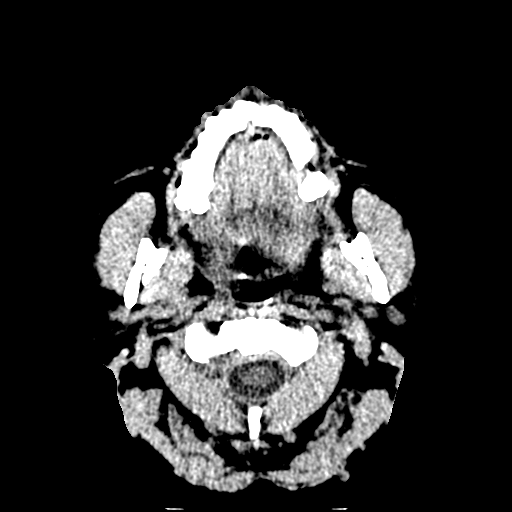
[im 33/78  bone]
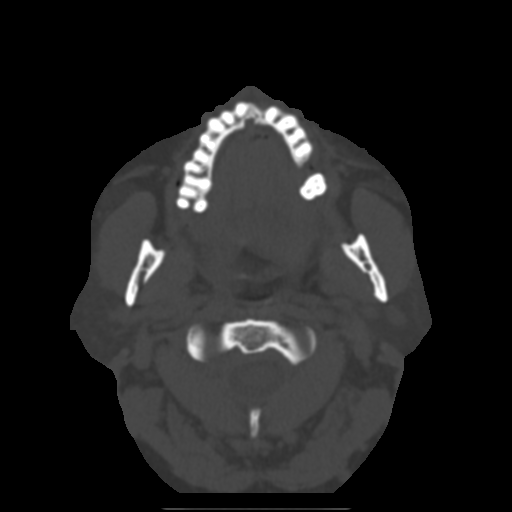
[im 39/78  brain]
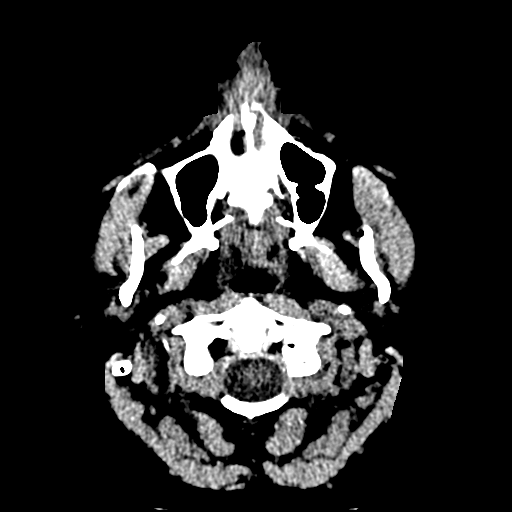
[im 45/78  brain]
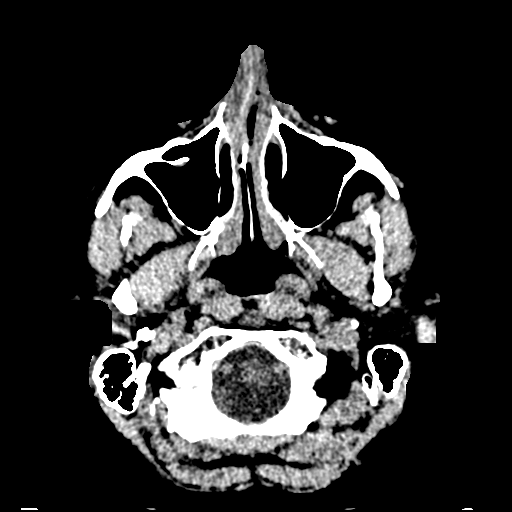
[im 52/78  brain]
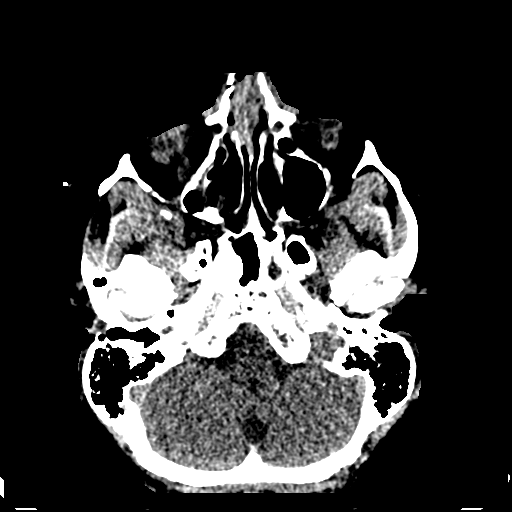
[im 58/78  brain]
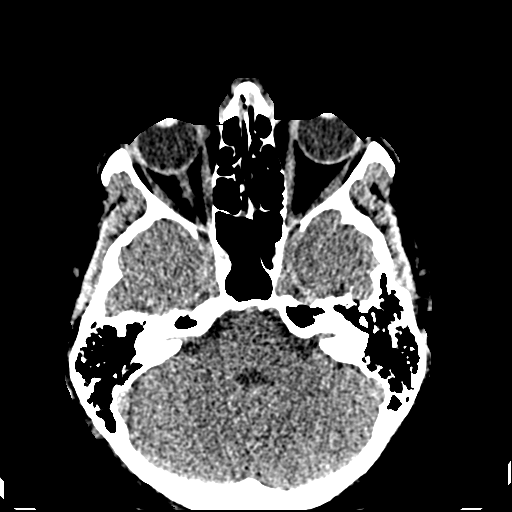
[im 58/78  bone]
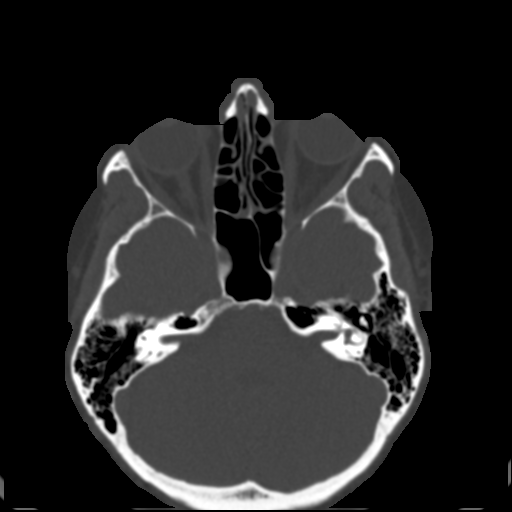
[im 65/78  brain]
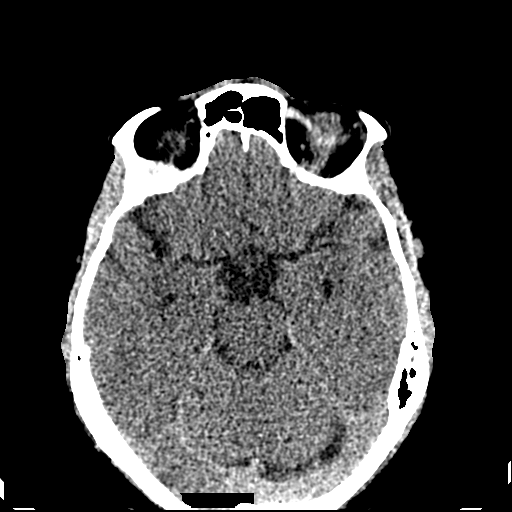
[im 71/78  brain]
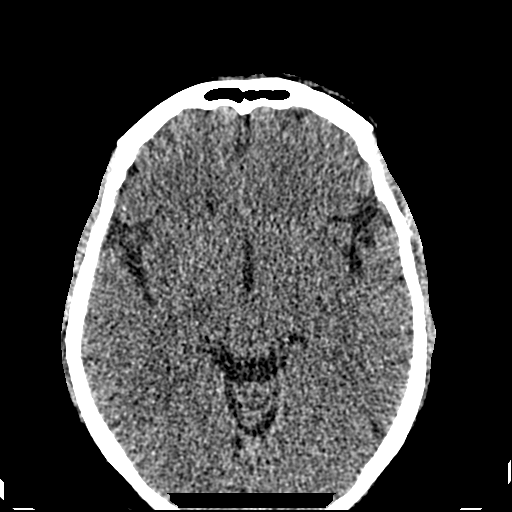

[15 of 30 positions shown; findings below may reference images not displayed]

FINDINGS: CT HEAD FINDINGS

No evidence for acute infarction, hemorrhage, mass lesion,
hydrocephalus, or extra-axial fluid. Normal for age cerebral volume.
No white matter disease of significance. Calvarium intact. No
contrecoup injury. LEFT frontal scalp hematoma with laceration.

CT MAXILLOFACIAL FINDINGS

Comminuted nasal bone fractures. Concomitant soft tissue swelling of
the nose. Nasal cavity hematoma. No sinus air-fluid level. No
blowout injury. No other facial fractures. TMJs located. Mandible
and maxilla grossly intact. Globes intact. No orbital hematoma.
Cervical spondylosis at C5-C6 without visible upper cervical
fracture or subluxation.
IMPRESSION: Comminuted nasal bone fractures. No other facial fracture, blowout
injury, or sinus air-fluid level.

No skull fracture or intracranial hemorrhage. LEFT frontal scalp
hematoma with laceration.

## 2017-06-02 ENCOUNTER — Encounter: Payer: Self-pay | Admitting: Family Medicine

## 2017-06-02 ENCOUNTER — Ambulatory Visit (INDEPENDENT_AMBULATORY_CARE_PROVIDER_SITE_OTHER): Admitting: Family Medicine

## 2017-06-02 VITALS — BP 114/72 | HR 67 | Temp 97.7°F | Ht 67.0 in | Wt 197.0 lb

## 2017-06-02 DIAGNOSIS — A6000 Herpesviral infection of urogenital system, unspecified: Secondary | ICD-10-CM | POA: Diagnosis not present

## 2017-06-02 DIAGNOSIS — R319 Hematuria, unspecified: Secondary | ICD-10-CM

## 2017-06-02 DIAGNOSIS — N183 Chronic kidney disease, stage 3 unspecified: Secondary | ICD-10-CM

## 2017-06-02 DIAGNOSIS — E785 Hyperlipidemia, unspecified: Secondary | ICD-10-CM | POA: Diagnosis not present

## 2017-06-02 DIAGNOSIS — Z566 Other physical and mental strain related to work: Secondary | ICD-10-CM

## 2017-06-02 DIAGNOSIS — R739 Hyperglycemia, unspecified: Secondary | ICD-10-CM

## 2017-06-02 DIAGNOSIS — Z0000 Encounter for general adult medical examination without abnormal findings: Secondary | ICD-10-CM

## 2017-06-02 MED ORDER — ACYCLOVIR 200 MG PO CAPS: 200.0000 mg | ORAL_CAPSULE | Freq: Every day | ORAL | 3 refills | Status: DC

## 2017-06-02 NOTE — Assessment & Plan Note (Signed)
HLD- on simvastatin 40mg  (half tablet) daily for a month then off for a month as was having regular myalgias on twice a week plus had poor control. Still gets some cramps with this but not as constant. At the end of her month today.  Lab Results  Component Value Date   CHOL 198 04/30/2016   HDL 38.50 (L) 04/30/2016   LDLCALC 120 (H) 04/30/2016   LDLDIRECT 161.7 08/07/2014   TRIG 198.0 (H) 04/30/2016   CHOLHDL 5 04/30/2016

## 2017-06-02 NOTE — Assessment & Plan Note (Signed)
Stress at work- controls anxiety with zoloft 50mg . No SI. CALM app has been very helpful as well.

## 2017-06-02 NOTE — Assessment & Plan Note (Signed)
CKD III- update bmet/GFR. Avoids nsaids. No PPI.

## 2017-06-02 NOTE — Assessment & Plan Note (Signed)
Genital herpes- acyclovir for flare ups on buttocks

## 2017-06-02 NOTE — Assessment & Plan Note (Signed)
Hyperglycemia- increased risk diabetes in past, update a1c Lab Results  Component Value Date   HGBA1C 6.0 08/07/2014

## 2017-06-02 NOTE — Assessment & Plan Note (Signed)
Hematuria- prior evaluation urology- will update and make sure not worsening

## 2017-06-02 NOTE — Progress Notes (Signed)
Phone: [phone]  Subjective:  Patient presents today for their annual physical. Chief complaint-noted.   See problem oriented charting- ROS- full  review of systems was completed and negative except for: occasional herpetic breakouts on buttokcs  The following were reviewed and entered/updated in epic: Past Medical History:  Diagnosis Date  . Cataract   . Chronic kidney disease   . Depression    stress  . Heart murmur    born with  . Hyperlipidemia    Patient Active Problem List   Diagnosis Date Noted  . Genital herpes 05/07/2016    Priority: Medium  . CKD (chronic kidney disease), stage III ([place]) 08/07/2014    Priority: Medium  . Hyperglycemia 08/07/2014    Priority: Medium  . Hyperlipidemia 04/02/2008    Priority: Medium  . Tendon rupture, nontraumatic, extensor 03/21/2015    Priority: Low  . Hormone replacement therapy 11/01/2014    Priority: Low  . Stress at work 08/07/2014    Priority: Low  . Injury of toenail 06/13/2014    Priority: Low  . Hematuria 05/02/2010    Priority: Low  . Strain of left pectoralis muscle 09/20/2015  . Chest pain 09/20/2015   Past Surgical History:  Procedure Laterality Date  . COLONOSCOPY    . COSMETIC SURGERY     facial  . fibroid tumors    . KNEE ARTHROSCOPY    . TONSILLECTOMY AND ADENOIDECTOMY     as a child  . WISDOM TOOTH EXTRACTION      Family History  Problem Relation Age of Onset  . Adopted: Yes  . Alcohol abuse Mother        Patient was adopted and not much else known  . Colon cancer Neg Hx     Medications- reviewed and updated Current Outpatient Prescriptions  Medication Sig Dispense Refill  . acyclovir (ZOVIRAX) 200 MG capsule Take 1 capsule (200 mg total) by mouth 5 (five) times daily. 25 capsule 3  . Multiple Vitamin (MULTIVITAMIN) tablet Take 1 tablet by mouth daily.    . Omega-3 1000 MG CAPS Take 2,000 mg by mouth every morning.     . sertraline (ZOLOFT) 50 MG tablet TAKE 1 TABLET DAILY 90 tablet 1   . simvastatin (ZOCOR) 40 MG tablet One by mouth daily or as directed. 90 tablet 3   No current facility-administered medications for this visit.     Allergies-reviewed and updated Allergies  Allergen Reactions  . Fentanyl Nausea Only  . Zocor [Simvastatin] Diarrhea    Muscle aches and pains    Social History   Social History  . Marital status: Married    Spouse name: N/A  . Number of children: N/A  . Years of education: N/A   Social History Main Topics  . Smoking status: Never Smoker  . Smokeless tobacco: Never Used  . Alcohol use 1.2 oz/week    2 Glasses of wine per week  . Drug use: Yes     Comment: occasional marijuana perhaps once a month-goes to [place]  . Sexual activity: Not Asked   Other Topics Concern  . None   Social History Narrative   Married 2006 (together since 1984). No children-1 aborted pregnancy. Many pets (at least 20 legs at any given time)      Works For [employer]. 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 Oropharynx normal Neck: no thyromegaly CV: RRR no murmurs rubs or gallops Lungs: CTAB no crackles, wheeze, rhonchi Abdomen: soft/nontender/nondistended/normal bowel sounds. No rebound or guarding.  Ext: no edema Skin: warm, dry Neuro: grossly normal, moves all extremities, PERRLA  Assessment/Plan:  61 y.o. female presenting for annual physical.  Health Maintenance counseling: 1. Anticipatory guidance: Patient counseled regarding regular dental exams -q6 months, eye exams -twice a year, wearing seatbelts.  2. Risk factor reduction:  Advised patient of need for regular exercise and diet rich and  fruits and vegetables to reduce risk of heart attack and stroke. Exercise- [place] [address]. Diet-down 5 lbs in last month/week- cut back substantially recently. Plans to sustain this. Wants to shoot for 185 at least.  Wt Readings from Last 3 Encounters:  06/02/17 197 lb (89.4 kg)  04/20/17 202 lb (91.6 kg)  09/25/16 201 lb 12.8 oz (91.5 kg)  3. Immunizations/screenings/ancillary studies- interested in shingrix when available Immunization History  Administered Date(s) Administered  . Influenza Split 04/22/2012  . Influenza,inj,Quad PF,6+ Mos 04/25/2013, 05/28/2014, 04/05/2015, 05/07/2016, 04/26/2017  . Influenza-Unspecified 06/01/2014  . Td 08/04/2003  . Tdap 08/07/2014  4. Cervical cancer screening- 07/02/14 [facility] with 3 year follow up- due shortly 5. Breast cancer screening-  breast exam today with GYN and mammogram 09/28/16 6. Colon cancer screening - 07/03/16 with 10 year follow up- got moved to 10 year follow thankfully 7. Skin cancer screening- Dr. Allyson Sabal yearly- considering switching. advised regular sunscreen use. Denies worrisome, changing, or new skin lesions.  8. Osteoporosis screening- will plan to start at 11  Status of chronic or acute concerns   Hyperlipidemia HLD- on simvastatin 40mg  (half tablet) daily for a month then off for a month as was having regular myalgias on twice a week plus had poor control. Still gets some cramps with this but not as constant. At the end of her month today.  Lab Results  Component Value Date   CHOL 198 04/30/2016   HDL 38.50 (L) 04/30/2016   LDLCALC 120 (H) 04/30/2016   LDLDIRECT 161.7 08/07/2014   TRIG 198.0 (H) 04/30/2016   CHOLHDL 5 04/30/2016    Stress at work Stress at work- controls anxiety with zoloft 50mg . No SI. CALM app has been very helpful as well.   Hyperglycemia Hyperglycemia- increased risk diabetes in past, update a1c Lab Results  Component Value Date   HGBA1C 6.0 08/07/2014      Genital herpes Genital herpes- acyclovir for flare ups on buttocks  CKD (chronic kidney disease), stage III CKD III- update bmet/GFR. Avoids nsaids. No PPI.   Hematuria Hematuria- prior evaluation urology- will update and make sure not worsening  1 year CPE at latest. 6 months if want to check in on kidney function  Orders Placed This Encounter  Procedures  . CBC    Standing Status:   Future    Standing Expiration Date:   06/02/2018  . Comprehensive metabolic panel    [place]    Standing Status:   Future    Standing Expiration Date:   06/02/2018  . Lipid panel    Standing Status:   Future    Standing Expiration Date:   06/02/2018  . Urinalysis  Standing Status:   Future    Standing Expiration Date:   06/02/2018  . Hemoglobin A1c    [place]    Standing Status:   Future    Standing Expiration Date:   06/02/2018    Meds ordered this encounter  Medications  . acyclovir (ZOVIRAX) 200 MG capsule    Sig: Take 1 capsule (200 mg total) by mouth 5 (five) times daily.    Dispense:  25 capsule    Refill:  3   Return precautions advised.  Garret Reddish, MD

## 2017-06-02 NOTE — Patient Instructions (Addendum)
Schedule a lab visit at the check out desk within 2 weeks. Return for future fasting labs meaning nothing but water after midnight please. Ok to take your medications with water.   Love your goal of 185 lbs.   No changes in medication  Hoping cholesterol is doing well with this regimen  Thanks for the CALM app tip

## 2017-06-10 ENCOUNTER — Other Ambulatory Visit (INDEPENDENT_AMBULATORY_CARE_PROVIDER_SITE_OTHER)

## 2017-06-10 DIAGNOSIS — N183 Chronic kidney disease, stage 3 unspecified: Secondary | ICD-10-CM

## 2017-06-10 DIAGNOSIS — E785 Hyperlipidemia, unspecified: Secondary | ICD-10-CM | POA: Diagnosis not present

## 2017-06-10 DIAGNOSIS — R739 Hyperglycemia, unspecified: Secondary | ICD-10-CM

## 2017-06-10 DIAGNOSIS — Z0000 Encounter for general adult medical examination without abnormal findings: Secondary | ICD-10-CM | POA: Diagnosis not present

## 2017-06-10 DIAGNOSIS — R319 Hematuria, unspecified: Secondary | ICD-10-CM

## 2017-06-10 LAB — URINALYSIS, ROUTINE W REFLEX MICROSCOPIC
BILIRUBIN URINE: NEGATIVE
Ketones, ur: NEGATIVE
NITRITE: NEGATIVE
PH: 6 (ref 5.0–8.0)
Specific Gravity, Urine: <=1.005 — AB (ref 1.000–1.030)
Total Protein, Urine: NEGATIVE
Urine Glucose: NEGATIVE
Urobilinogen, UA: 0.2 (ref 0.0–1.0)

## 2017-06-10 LAB — COMPREHENSIVE METABOLIC PANEL
ALBUMIN: 4.5 g/dL (ref 3.5–5.2)
ALT: 28 U/L (ref 0–35)
AST: 27 U/L (ref 0–37)
Alkaline Phosphatase: 71 U/L (ref 39–117)
BUN: 16 mg/dL (ref 6–23)
CHLORIDE: 105 meq/L (ref 96–112)
CO2: 27 meq/L (ref 19–32)
CREATININE: 1.12 mg/dL (ref 0.40–1.20)
Calcium: 9.5 mg/dL (ref 8.4–10.5)
GFR: 52.28 mL/min — ABNORMAL LOW (ref >60.00)
Glucose, Bld: 117 mg/dL — ABNORMAL HIGH (ref 70–99)
Potassium: 4 mEq/L (ref 3.5–5.1)
SODIUM: 138 meq/L (ref 135–145)
Total Bilirubin: 0.4 mg/dL (ref 0.2–1.2)
Total Protein: 7.3 g/dL (ref 6.0–8.3)

## 2017-06-10 LAB — CBC
HEMATOCRIT: 43.3 % (ref 36.0–46.0)
Hemoglobin: 14.5 g/dL (ref 12.0–15.0)
MCHC: 33.5 g/dL (ref 30.0–36.0)
MCV: 91.8 fl (ref 78.0–100.0)
Platelets: 243 10*3/uL (ref 150.0–400.0)
RBC: 4.72 Mil/uL (ref 3.87–5.11)
RDW: 13.3 % (ref 11.5–15.5)
WBC: 4.8 10*3/uL (ref 4.0–10.5)

## 2017-06-10 LAB — LIPID PANEL
CHOL/HDL RATIO: 6
Cholesterol: 210 mg/dL — ABNORMAL HIGH (ref 0–200)
HDL: 37.3 mg/dL — AB (ref >39.00)
NONHDL: 172.96
Triglycerides: 208 mg/dL — ABNORMAL HIGH (ref 0.0–149.0)
VLDL: 41.6 mg/dL — AB (ref 0.0–40.0)

## 2017-06-10 LAB — LDL CHOLESTEROL, DIRECT: Direct LDL: 135 mg/dL

## 2017-06-10 LAB — HEMOGLOBIN A1C: Hgb A1c MFr Bld: 5.7 % (ref 4.6–6.5)

## 2017-06-11 ENCOUNTER — Other Ambulatory Visit: Payer: Self-pay

## 2017-06-11 DIAGNOSIS — R319 Hematuria, unspecified: Secondary | ICD-10-CM

## 2017-06-18 ENCOUNTER — Other Ambulatory Visit (INDEPENDENT_AMBULATORY_CARE_PROVIDER_SITE_OTHER)

## 2017-06-18 DIAGNOSIS — R319 Hematuria, unspecified: Secondary | ICD-10-CM | POA: Diagnosis not present

## 2017-06-18 LAB — URINALYSIS, ROUTINE W REFLEX MICROSCOPIC
BILIRUBIN URINE: NEGATIVE
KETONES UR: NEGATIVE
NITRITE: NEGATIVE
PH: 5.5 (ref 5.0–8.0)
TOTAL PROTEIN, URINE-UPE24: NEGATIVE
URINE GLUCOSE: NEGATIVE
Urobilinogen, UA: 0.2 (ref 0.0–1.0)

## 2017-09-28 LAB — HM MAMMOGRAPHY

## 2017-10-10 ENCOUNTER — Other Ambulatory Visit: Payer: Self-pay | Admitting: Family Medicine

## 2017-10-19 ENCOUNTER — Other Ambulatory Visit: Payer: Self-pay | Admitting: Family Medicine

## 2017-11-01 LAB — HM PAP SMEAR: HM Pap smear: NEGATIVE

## 2017-11-04 ENCOUNTER — Encounter: Payer: Self-pay | Admitting: Family Medicine

## 2017-11-09 ENCOUNTER — Encounter: Payer: Self-pay | Admitting: Family Medicine

## 2017-11-10 ENCOUNTER — Encounter: Payer: Self-pay | Admitting: Family Medicine

## 2017-12-03 LAB — HM MAMMOGRAPHY

## 2017-12-14 ENCOUNTER — Encounter: Payer: Self-pay | Admitting: Family Medicine

## 2018-01-20 ENCOUNTER — Encounter: Payer: Self-pay | Admitting: Family Medicine

## 2018-01-20 ENCOUNTER — Telehealth: Payer: Self-pay

## 2018-01-20 ENCOUNTER — Ambulatory Visit (INDEPENDENT_AMBULATORY_CARE_PROVIDER_SITE_OTHER): Admitting: Family Medicine

## 2018-01-20 VITALS — BP 120/70 | HR 79 | Temp 97.9°F | Ht 67.0 in | Wt 193.2 lb

## 2018-01-20 DIAGNOSIS — N644 Mastodynia: Secondary | ICD-10-CM

## 2018-01-20 NOTE — Patient Instructions (Signed)
It was very nice to see you today!  We will check an ultrasound to make sure there are no structural abnormalities.  Please continue making sure you are wearing well-fitting bras. You can also use alternating hot and cold packs to the area. Take tylenol as needed.  You can also try taking evening primrose oil.  We will contact you with the results of your ultrasound after they are available.   Take care, Dr Jerline Pain

## 2018-01-20 NOTE — Progress Notes (Signed)
   Subjective:  Amber Jacobs is a 62 y.o. female who presents today for same-day appointment with a chief complaint of breast pain.   HPI:  Breast Pain, acute problem Started about a month ago. Located on the bottom of her left breast. Pain is intermittent in nature. No nipple discharge. Sometimes radiates towards the nipple. Symptoms are about the same. No obvious precipitating or aggravating factors. No rash.   ROS: Per HPI  PMH: She reports that she has never smoked. She has never used smokeless tobacco. She reports that she drinks about 1.2 oz of alcohol per week. She reports that she has current or past drug history.  Objective:  Physical Exam: BP 120/70 (BP Location: Left Arm, Patient Position: Sitting, Cuff Size: Normal)   Pulse 79   Temp 97.9 F (36.6 C) (Oral)   Ht 5\' 7"  (1.702 m)   Wt 193 lb 3.2 oz (87.6 kg)   SpO2 96%   BMI 30.26 kg/m   Gen: NAD, resting comfortably Chest: Left breast with small amount of cystic changes at the base.  Otherwise no masses, lumps, or other abnormalities.  Tender to palpation from the 3:00 to 9 o'clock position along the inferior aspect of her left breast.  Chaperone was present for exam.  Assessment/Plan:  Breast pain Pain is reproducible on exam.  Do not think that this is chest pain.  Unclear underlying etiology.  Had a mammogram last month which was normal.  She has very dense breast which limit the utility of phase screening mammogram.  We will check an ultrasound to look for any other underlying anatomic abnormalities.  Also recommended continue supportive care with well fitting bras, Tylenol as needed, and alternating ice/heat packs.  Also recommended evening primrose oil.  Discussed reasons to return to care.  Algis Greenhouse. Jerline Pain, MD 01/20/2018 1:58 PM

## 2018-01-20 NOTE — Telephone Encounter (Signed)
I have attempted to contact patient Amber Jacobs to triage for upcoming appointment with Dr. Jerline Pain at 1:40 this afternoon.  Left message.  Will attempt to contact again.

## 2018-01-22 ENCOUNTER — Other Ambulatory Visit: Payer: Self-pay | Admitting: Family Medicine

## 2018-01-22 DIAGNOSIS — N644 Mastodynia: Secondary | ICD-10-CM

## 2018-01-28 ENCOUNTER — Ambulatory Visit
Admission: RE | Admit: 2018-01-28 | Discharge: 2018-01-28 | Disposition: A | Discharge status: Home / Self Care | Source: Ambulatory Visit | Attending: Family Medicine | Admitting: Family Medicine

## 2018-01-28 DIAGNOSIS — N644 Mastodynia: Secondary | ICD-10-CM

## 2018-04-17 ENCOUNTER — Other Ambulatory Visit: Payer: Self-pay | Admitting: Family Medicine

## 2018-05-13 ENCOUNTER — Ambulatory Visit: Payer: PRIVATE HEALTH INSURANCE

## 2018-05-18 ENCOUNTER — Encounter: Payer: Self-pay | Admitting: Family Medicine

## 2018-05-18 ENCOUNTER — Ambulatory Visit (INDEPENDENT_AMBULATORY_CARE_PROVIDER_SITE_OTHER)

## 2018-05-18 DIAGNOSIS — Z23 Encounter for immunization: Secondary | ICD-10-CM

## 2018-10-27 ENCOUNTER — Encounter: Payer: Self-pay | Admitting: Family Medicine

## 2018-10-27 ENCOUNTER — Ambulatory Visit (INDEPENDENT_AMBULATORY_CARE_PROVIDER_SITE_OTHER): Admitting: Family Medicine

## 2018-10-27 ENCOUNTER — Other Ambulatory Visit: Payer: Self-pay

## 2018-10-27 VITALS — BP 124/76 | Temp 98.7°F | Ht 67.0 in | Wt 198.0 lb

## 2018-10-27 DIAGNOSIS — E785 Hyperlipidemia, unspecified: Secondary | ICD-10-CM

## 2018-10-27 DIAGNOSIS — L639 Alopecia areata, unspecified: Secondary | ICD-10-CM | POA: Diagnosis not present

## 2018-10-27 DIAGNOSIS — N183 Chronic kidney disease, stage 3 unspecified: Secondary | ICD-10-CM

## 2018-10-27 DIAGNOSIS — J4520 Mild intermittent asthma, uncomplicated: Secondary | ICD-10-CM | POA: Diagnosis not present

## 2018-10-27 DIAGNOSIS — J45909 Unspecified asthma, uncomplicated: Secondary | ICD-10-CM | POA: Insufficient documentation

## 2018-10-27 DIAGNOSIS — A6000 Herpesviral infection of urogenital system, unspecified: Secondary | ICD-10-CM | POA: Diagnosis not present

## 2018-10-27 DIAGNOSIS — Z566 Other physical and mental strain related to work: Secondary | ICD-10-CM

## 2018-10-27 MED ORDER — SIMVASTATIN 40 MG PO TABS: ORAL_TABLET | ORAL | 3 refills | Status: DC

## 2018-10-27 MED ORDER — ACYCLOVIR 200 MG PO CAPS: ORAL_CAPSULE | ORAL | 3 refills | Status: DC

## 2018-10-27 NOTE — Assessment & Plan Note (Signed)
S: Mild poorly controlled on simvastatin 40 mg-previously with diarrhea on this but she has started Metamucil and apparently this has resolved issue Lab Results  Component Value Date   CHOL 210 (H) 06/10/2017   HDL 37.30 (L) 06/10/2017   LDLCALC 120 (H) 04/30/2016   LDLDIRECT 135.0 06/10/2017   TRIG 208.0 (H) 06/10/2017   CHOLHDL 6 06/10/2017   A/P: Need to get updated lipids but patient would like to delay this 3 to 6 months given COVID-19 pandemic-also need to get follow-up BMP given CKD stage III

## 2018-10-27 NOTE — Patient Instructions (Addendum)
We will call you within two weeks about your referral to dermatolgoy. If you do not hear within 3 weeks, give [place] a call.   3-6 months for physical/bloodwork  Sent in refills

## 2018-10-27 NOTE — Assessment & Plan Note (Signed)
S:patient reports history of asthma- we did not have this on her problem list or history- but she states had been reported to prior physician.   She is occasionally with activity getting some shortness of breath. No chest pain reported.   A/P: Patient with some shortness of breath and wheeze-she declines inhaler for now.  She wants to discuss this further at her next visit.  She denies any chest pain but would like to consider possible stress testing-we will discuss further next visit but she agrees if she has new or worsening symptoms to let us know immediately

## 2018-10-27 NOTE — Assessment & Plan Note (Signed)
S: Patient and followed with Dr. Allyson Sabal for many years- when she would get patches of hair loss-injections with Dr. Allyson Sabal.  She saw Dr. Allyson Sabal for placement Dr. Pearline Cables yesterday.  Due to coronavirus-he only examined her with a tongue depressor and did not touch her scalp.  He placed her on acne and psoriasis medication.  Betamethasone topically and minocycline once daily orally.  No flaking or scaling- and just colored hair. Was told injections was old school- has worked for her so she was discouraged by this. A/P: Dr. Pearline Cables was not the best fit for patient-she would like to get a second opinion.  She has not applied the betamethasone or trialed the minocycline -Patient asked for my opinion on local dermatologist-I referred her to [facility] dermatology- may take some time to get in but they have provided excellent care to many my patients

## 2018-10-27 NOTE — Telephone Encounter (Signed)
Please see message . Thank you .

## 2018-10-27 NOTE — Progress Notes (Signed)
Phone [phone]   Subjective:  Virtual visit via Video note  Our team/I connected with Amber Jacobs on 10/27/18 at  1:00 PM EDT by phone (patient did not have equipment for webex) and verified that I am speaking with the correct person using two identifiers.  Location patient: Home[place] Location provider: [facility], office Persons participating in the virtual visit:  pateint  Our team/I discussed the limitations of evaluation and management by telemedicine and the availability of in person appointments. In light of current covid-19 pandemic, patient also understands that we are trying to protect them by minimizing in office contact if at all possible.  The patient expressed consent for telemedicine visit and agreed to proceed.   ROS- No chest pain.  No headache or blurry vision. No SI.  Mild SOB at times with her asthma.   Past Medical History-  Patient Active Problem List   Diagnosis Date Noted   Asthma 10/27/2018    Priority: Medium   Genital herpes 05/07/2016    Priority: Medium   CKD (chronic kidney disease), stage III ([place]) 08/07/2014    Priority: Medium   Hyperglycemia 08/07/2014    Priority: Medium   Hyperlipidemia 04/02/2008    Priority: Medium   Tendon rupture, nontraumatic, extensor 03/21/2015    Priority: Low   Hormone replacement therapy 11/01/2014    Priority: Low   Stress at work 08/07/2014    Priority: Low   Injury of toenail 06/13/2014    Priority: Low   Hematuria 05/02/2010    Priority: Low   Alopecia areata 10/27/2018   Strain of left pectoralis muscle 09/20/2015   Chest pain 09/20/2015    Medications- reviewed and updated Current Outpatient Medications  Medication Sig Dispense Refill   acyclovir (ZOVIRAX) 200 MG capsule TAKE 1 CAPSULE FIVE TIMES A DAY 25 capsule 3   Multiple Vitamin (MULTIVITAMIN) tablet Take 1 tablet by mouth daily.     Omega-3 1000 MG CAPS Take 2,000 mg by mouth every morning.      sertraline (ZOLOFT) 50 MG  tablet TAKE 1 TABLET DAILY 90 tablet 3   simvastatin (ZOCOR) 40 MG tablet One by mouth daily or as directed. 90 tablet 3   VITAMIN D-VITAMIN K PO Take by mouth. 1 tablet daily     chlorhexidine (PERIDEX) 0.12 % solution SWISH 1 CAPFUL FOR 30 SECONDS BID THEN EXPECTORATE     No current facility-administered medications for this visit.      Objective:  BP 124/76 (Patient Position: Sitting)    Temp 98.7 F (37.1 C) (Oral)    Ht 5\' 7"  (1.702 m)    Wt 198 lb (89.8 kg)    BMI 31.01 kg/m  Gen: NAD, resting comfortably Lungs: nonlabored, normal respiratory rate  Skin: warm, dry, no obvious rash, cannot see area in scalp of concern well by video     Assessment and Plan   #Genital herpes S:  Stress recently increased. starting with new outbreak and doesn't have full 25 pills left (usually does for 5 days) A/P: Poor control at present with current flare-refilled acyclovir today  #Alopecia areata S: Patient and followed with Dr. Allyson Sabal for many years- when she would get patches of hair loss-injections with Dr. Allyson Sabal.  She saw Dr. Allyson Sabal for placement Dr. Pearline Cables yesterday.  Due to coronavirus-he only examined her with a tongue depressor and did not touch her scalp.  He placed her on acne and psoriasis medication.  Betamethasone topically and minocycline once daily orally.  No flaking or  scaling- and just colored hair. Was told injections was old school- has worked for her so she was discouraged by this. A/P: Dr. Pearline Cables was not the best fit for patient-she would like to get a second opinion.  She has not applied the betamethasone or trialed the minocycline -Patient asked for my opinion on local dermatologist-I referred her to [facility] dermat[facility]- may take some time to get in but they have provided excellent care to many my patients  # Asthma S:patient reports history of asthma- we did not have this on her problem list or history- but she states had been reported to prior physician.   She is  occasionally with activity getting some shortness of breath. No chest pain reported.   A/P: Patient with some shortness of breath and wheeze-she declines inhaler for now.  She wants to discuss this further at her next visit.  She denies any chest pain but would like to consider possible stress testing-we will discuss further next visit but she agrees if she has new or worsening symptoms to let us know immediately   #hyperlipidemia S: Mild poorly controlled on simvastatin 40 mg-previously with diarrhea on this but she has started Metamucil and apparently this has resolved issue Lab Results  Component Value Date   CHOL 210 (H) 06/10/2017   HDL 37.30 (L) 06/10/2017   LDLCALC 120 (H) 04/30/2016   LDLDIRECT 135.0 06/10/2017   TRIG 208.0 (H) 06/10/2017   CHOLHDL 6 06/10/2017   A/P: Need to get updated lipids but patient would like to delay this 3 to 6 months given COVID-19 pandemic-also need to get follow-up BMP given CKD stage III  Alopecia areata S: Patient and followed with Dr. Allyson Sabal for many years- when she would get patches of hair loss-injections with Dr. Allyson Sabal.  She saw Dr. Allyson Sabal for placement Dr. Pearline Cables yesterday.  Due to coronavirus-he only examined her with a tongue depressor and did not touch her scalp.  He placed her on acne and psoriasis medication.  Betamethasone topically and minocycline once daily orally.  No flaking or scaling- and just colored hair. Was told injections was old school- has worked for her so she was discouraged by this. A/P: Dr. Pearline Cables was not the best fit for patient-she would like to get a second opinion.  She has not applied the betamethasone or trialed the minocycline -Patient asked for my opinion on local dermatologist-I referred her to [facility] dermatology- may take some time to get in but they have provided excellent care to many my patients  Asthma S:patient reports history of asthma- we did not have this on her problem list or history- but she states had  been reported to prior physician.   She is occasionally with activity getting some shortness of breath. No chest pain reported.   A/P: Patient with some shortness of breath and wheeze-she declines inhaler for now.  She wants to discuss this further at her next visit.  She denies any chest pain but would like to consider possible stress testing-we will discuss further next visit but she agrees if she has new or worsening symptoms to let us know immediately   Stress at work Patient reports stress remains well controlled on Zoloft.  PHQ 9 of 0 today.  No SI reported  Hyperlipidemia S: Mild poorly controlled on simvastatin 40 mg-previously with diarrhea on this but she has started Metamucil and apparently this has resolved issue Lab Results  Component Value Date   CHOL 210 (H) 06/10/2017   HDL 37.30 (  L) 06/10/2017   LDLCALC 120 (H) 04/30/2016   LDLDIRECT 135.0 06/10/2017   TRIG 208.0 (H) 06/10/2017   CHOLHDL 6 06/10/2017   A/P: Need to get updated lipids but patient would like to delay this 3 to 6 months given COVID-19 pandemic-also need to get follow-up BMP given CKD stage III   Lab/Order associations: Genital herpes simplex, unspecified site  Alopecia areata - Plan: Ambulatory referral to Dermatology  Mild intermittent asthma without complication  Stress at work  Hyperlipidemia, unspecified hyperlipidemia type  CKD (chronic kidney disease), stage III ([place])  Meds ordered this encounter  Medications   acyclovir (ZOVIRAX) 200 MG capsule    Sig: TAKE 1 CAPSULE FIVE TIMES A DAY    Dispense:  25 capsule    Refill:  3   simvastatin (ZOCOR) 40 MG tablet    Sig: One by mouth daily or as directed.    Dispense:  90 tablet    Refill:  3   Return precautions advised.  Garret Reddish, MD

## 2018-10-27 NOTE — Telephone Encounter (Signed)
Rx refill Zovirax 200 mg Last refill 10/11/17 #25/3 Last OV 06/02/17

## 2018-10-27 NOTE — Assessment & Plan Note (Signed)
Patient reports stress remains well controlled on Zoloft.  PHQ 9 of 0 today.  No SI reported

## 2019-02-06 ENCOUNTER — Encounter: Payer: Self-pay | Admitting: Family Medicine

## 2019-03-20 LAB — HM MAMMOGRAPHY

## 2019-03-21 ENCOUNTER — Other Ambulatory Visit: Payer: Self-pay

## 2019-03-21 ENCOUNTER — Ambulatory Visit: Payer: Self-pay

## 2019-03-21 ENCOUNTER — Telehealth (INDEPENDENT_AMBULATORY_CARE_PROVIDER_SITE_OTHER): Admitting: Family Medicine

## 2019-03-21 DIAGNOSIS — L03113 Cellulitis of right upper limb: Secondary | ICD-10-CM

## 2019-03-21 MED ORDER — DOXYCYCLINE HYCLATE 100 MG PO TABS: 100.0000 mg | ORAL_TABLET | Freq: Two times a day (BID) | ORAL | 0 refills | Status: DC

## 2019-03-21 NOTE — Progress Notes (Signed)
Virtual Visit via Video Note  I connected with Amber Jacobs  on 03/21/19 at  4:00 PM EDT by a video enabled telemedicine application and verified that I am speaking with the correct person using two identifiers.  Location patient: home Location provider:work or home office Persons participating in the virtual visit: patient, provider  I discussed the limitations of evaluation and management by telemedicine and the availability of in person appointments. The patient expressed understanding and agreed to proceed.   HPI:  Acute visit for a skin infection around a tick bite: -she removed it "dug around" with tweezers yesterday -thinks was attached for about 2 days -reports was a deer tick -itches and swollen and red around the tick bite -she works outside a lot -denies fevers, malaise, rashes anywhere else, bodyaches or illness otherwise  ROS: See pertinent positives and negatives per HPI.  Past Medical History:  Diagnosis Date  . Cataract   . Chronic kidney disease   . Depression    stress  . Heart murmur    born with  . Hyperlipidemia     Past Surgical History:  Procedure Laterality Date  . COLONOSCOPY    . COSMETIC SURGERY     facial  . fibroid tumors    . KNEE ARTHROSCOPY    . TONSILLECTOMY AND ADENOIDECTOMY     as a child  . WISDOM TOOTH EXTRACTION      Family History  Adopted: Yes  Problem Relation Age of Onset  . Alcohol abuse Mother        Patient was adopted and not much else known  . Colon cancer Neg Hx     SOCIAL HX: see hpi   Current Outpatient Medications:  .  acyclovir (ZOVIRAX) 200 MG capsule, TAKE 1 CAPSULE FIVE TIMES A DAY, Disp: 25 capsule, Rfl: 3 .  chlorhexidine (PERIDEX) 0.12 % solution, SWISH 1 CAPFUL FOR 30 SECONDS BID THEN EXPECTORATE, Disp: , Rfl:  .  doxycycline (VIBRA-TABS) 100 MG tablet, Take 1 tablet (100 mg total) by mouth 2 (two) times daily., Disp: 10 tablet, Rfl: 0 .  Multiple Vitamin (MULTIVITAMIN) tablet, Take 1 tablet by mouth  daily., Disp: , Rfl:  .  Omega-3 1000 MG CAPS, Take 2,000 mg by mouth every morning. , Disp: , Rfl:  .  sertraline (ZOLOFT) 50 MG tablet, TAKE 1 TABLET DAILY, Disp: 90 tablet, Rfl: 3 .  simvastatin (ZOCOR) 40 MG tablet, One by mouth daily or as directed., Disp: 90 tablet, Rfl: 3 .  VITAMIN D-VITAMIN K PO, Take by mouth. 1 tablet daily, Disp: , Rfl:   EXAM:  VITALS per patient if applicable:  GENERAL: alert, oriented, appears well and in no acute distress  HEENT: atraumatic, conjunttiva clear, no obvious abnormalities on inspection of external nose and ears  NECK: normal movements of the head and neck  LUNGS: on inspection no signs of respiratory distress, breathing rate appears normal, no obvious gross SOB, gasping or wheezing  CV: no obvious cyanosis  SKIN: area of erythema and edema the size of a plum on the R upper arm, central small scab, no appreciable pus or fluctuants on patient self exam  MS: moves all visible extremities without noticeable abnormality  PSYCH/NEURO: pleasant and cooperative, no obvious depression or anxiety, speech and thought processing grossly intact  ASSESSMENT AND PLAN:  Discussed the following assessment and plan:  Cellulitis of right upper extremity Tick bite  -we discussed possible serious and likely etiologies, workup and treatment, treatment risks and return precautions. Suspect  possible cellulitis vs local reaction. She is concerned for cellulitis. -after this discussion, Amber Jacobs opted for treatment with doxycycline 100mg  bid for 5 days for cellulitis. Also discussed tick borne illness, signs and symptoms, etc. -follow up advised if symptoms worsen or persist or new concerns arise.    Lucretia Kern, DO   Patient Instructions   -I sent the medication we discussed to your pharmacy. Meds ordered this encounter  Medications  . doxycycline (VIBRA-TABS) 100 MG tablet    Sig: Take 1 tablet (100 mg total) by mouth 2 (two) times daily.     Dispense:  10 tablet    Refill:  0    Please let us know if you have any questions or concerns regarding this prescription.  I hope you are feeling better soon! Seek care promptly if your symptoms worsen, new concerns arise or you are not improving with treatment.   Tick Bite Information, Adult  Ticks are insects that can bite. Most ticks live in shrubs and grassy areas. They climb onto people and animals that go by. Then they bite. Some ticks carry germs that can make you sick. How can I prevent tick bites?  Use an insect repellent that has 20% or higher of the ingredients DEET, picaridin, or IR3535. Put this insect repellent on: ? Bare skin. ? The tops of your boots. ? Your pant legs. ? The ends of your sleeves.  If you use an insect repellent that has the ingredient permethrin, make sure to follow the instructions on the bottle. Treat the following: ? Clothing. ? Supplies. ? Boots. ? Tents.  Wear long sleeves, long pants, and light colors.  Tuck your pant legs into your socks.  Stay in the middle of the trail.  Try not to walk through long grass.  Before going inside your house, check your clothes, hair, and skin for ticks. Make sure to check your head, neck, armpits, waist, groin, and joint areas.  Check for ticks every day.  When you come indoors: ? Wash your clothes right away. ? Shower right away. ? Dry your clothes in a dryer on high heat for 60 minutes or more. What is the right way to remove a tick? Remove a tick from your skin as soon as possible.  To remove a tick that is crawling on your skin: ? Go outdoors and brush the tick off. ? Use tape or a lint roller.  To remove a tick that is biting: ? Wash your hands. ? If you have latex gloves, put them on. ? Use tweezers, curved forceps, or a tick-removal tool to grasp the tick. Grasp the tick as close to your skin and as close to the tick's head as possible. ? Gently pull up until the tick lets go.  Try  to keep the tick's head attached to its body.  Do not twist or jerk the tick.  Do not squeeze or crush the tick. Do not try to remove a tick with heat, alcohol, petroleum jelly, or fingernail polish. How should I get rid of a tick? Here are some ways to get rid of a tick that is alive:  Place the tick in rubbing alcohol.  Place the tick in a bag or container you can close tightly.  Wrap the tick tightly in tape.  Flush the tick down the toilet. Contact a doctor if:  You have symptoms of a disease, such as: ? Pain in a muscle, joint, or bone. ? Trouble walking or moving  your legs. ? Numbness in your legs. ? Inability to move (paralysis). ? A red rash that makes a circle (bull's-eye rash). ? Redness and swelling where the tick bit you. ? A fever. ? Throwing up (vomiting) over and over. ? Diarrhea. ? Weight loss. ? Tender and swollen lymph glands. ? Shortness of breath. ? Cough. ? Belly pain (abdominal pain). ? Headache. ? Being more tired than normal. ? A change in how alert (conscious) you are. ? Confusion. Get help right away if:  You cannot remove a tick.  A part of a tick breaks off and gets stuck in your skin.  You are feeling worse. Summary  Ticks may carry germs that can make you sick.  To prevent tick bites, wear long sleeves, long pants, and light colors. Use insect repellent. Follow the instructions on the bottle.  If the tick is biting, do not try to remove it with heat, alcohol, petroleum jelly, or fingernail polish.  Use tweezers, curved forceps, or a tick-removal tool to grasp the tick. Gently pull up until the tick lets go. Do not twist or jerk the tick. Do not squeeze or crush the tick.  If you have symptoms, contact a doctor. This information is not intended to replace advice given to you by your health care provider. Make sure you discuss any questions you have with your health care provider. Document Released: 10/14/2009 Document Revised:  07/04/2018 Document Reviewed: 10/30/2016 Elsevier Patient Education  2020 [employer].

## 2019-03-21 NOTE — Telephone Encounter (Signed)
Tick bite right forearm. Bullseye rash around it, warm to touch. No fever. Feels sure tick was there x 2 days, removed the tick yesterday. Warm transfer to [facility] in the practice for an appointment.  Answer Assessment - Initial Assessment Questions 1. TYPE of TICK: "Is it a wood tick or a deer tick?" If unsure, ask: "What size was the tick?" "Did it look more like a watermelon seed or a poppy seed?"      Small - black 2. LOCATION: "Where is the tick bite located?"      Right forearm 3. ONSET: "How long do you think the tick was attached before you removed it?" (Hours or days)      2 days 4. TETANUS: "When was the last tetanus booster?"      Unsure 5. PREGNANCY: "Is there any chance you are pregnant?" "When was your last menstrual period?"     No  Protocols used: TICK BITE-A-AH

## 2019-03-21 NOTE — Patient Instructions (Signed)
-I sent the medication we discussed to your pharmacy. Meds ordered this encounter  Medications  . doxycycline (VIBRA-TABS) 100 MG tablet    Sig: Take 1 tablet (100 mg total) by mouth 2 (two) times daily.    Dispense:  10 tablet    Refill:  0    Please let us know if you have any questions or concerns regarding this prescription.  I hope you are feeling better soon! Seek care promptly if your symptoms worsen, new concerns arise or you are not improving with treatment.   Tick Bite Information, Adult  Ticks are insects that can bite. Most ticks live in shrubs and grassy areas. They climb onto people and animals that go by. Then they bite. Some ticks carry germs that can make you sick. How can I prevent tick bites?  Use an insect repellent that has 20% or higher of the ingredients DEET, picaridin, or IR3535. Put this insect repellent on: ? Bare skin. ? The tops of your boots. ? Your pant legs. ? The ends of your sleeves.  If you use an insect repellent that has the ingredient permethrin, make sure to follow the instructions on the bottle. Treat the following: ? Clothing. ? Supplies. ? Boots. ? Tents.  Wear long sleeves, long pants, and light colors.  Tuck your pant legs into your socks.  Stay in the middle of the trail.  Try not to walk through long grass.  Before going inside your house, check your clothes, hair, and skin for ticks. Make sure to check your head, neck, armpits, waist, groin, and joint areas.  Check for ticks every day.  When you come indoors: ? Wash your clothes right away. ? Shower right away. ? Dry your clothes in a dryer on high heat for 60 minutes or more. What is the right way to remove a tick? Remove a tick from your skin as soon as possible.  To remove a tick that is crawling on your skin: ? Go outdoors and brush the tick off. ? Use tape or a lint roller.  To remove a tick that is biting: ? Wash your hands. ? If you have latex gloves, put  them on. ? Use tweezers, curved forceps, or a tick-removal tool to grasp the tick. Grasp the tick as close to your skin and as close to the tick's head as possible. ? Gently pull up until the tick lets go.  Try to keep the tick's head attached to its body.  Do not twist or jerk the tick.  Do not squeeze or crush the tick. Do not try to remove a tick with heat, alcohol, petroleum jelly, or fingernail polish. How should I get rid of a tick? Here are some ways to get rid of a tick that is alive:  Place the tick in rubbing alcohol.  Place the tick in a bag or container you can close tightly.  Wrap the tick tightly in tape.  Flush the tick down the toilet. Contact a doctor if:  You have symptoms of a disease, such as: ? Pain in a muscle, joint, or bone. ? Trouble walking or moving your legs. ? Numbness in your legs. ? Inability to move (paralysis). ? A red rash that makes a circle (bull's-eye rash). ? Redness and swelling where the tick bit you. ? A fever. ? Throwing up (vomiting) over and over. ? Diarrhea. ? Weight loss. ? Tender and swollen lymph glands. ? Shortness of breath. ? Cough. ? Belly pain (  abdominal pain). ? Headache. ? Being more tired than normal. ? A change in how alert (conscious) you are. ? Confusion. Get help right away if:  You cannot remove a tick.  A part of a tick breaks off and gets stuck in your skin.  You are feeling worse. Summary  Ticks may carry germs that can make you sick.  To prevent tick bites, wear long sleeves, long pants, and light colors. Use insect repellent. Follow the instructions on the bottle.  If the tick is biting, do not try to remove it with heat, alcohol, petroleum jelly, or fingernail polish.  Use tweezers, curved forceps, or a tick-removal tool to grasp the tick. Gently pull up until the tick lets go. Do not twist or jerk the tick. Do not squeeze or crush the tick.  If you have symptoms, contact a doctor. This  information is not intended to replace advice given to you by your health care provider. Make sure you discuss any questions you have with your health care provider. Document Released: 10/14/2009 Document Revised: 07/04/2018 Document Reviewed: 10/30/2016 Elsevier Patient Education  2020 [employer].

## 2019-04-05 ENCOUNTER — Encounter: Payer: Self-pay | Admitting: Family Medicine

## 2019-04-07 ENCOUNTER — Encounter: Payer: Self-pay | Admitting: Family Medicine

## 2019-04-12 ENCOUNTER — Other Ambulatory Visit: Payer: Self-pay | Admitting: Family Medicine

## 2019-05-08 NOTE — Patient Instructions (Addendum)
Health Maintenance Due  Topic Date Due  . INFLUENZA VACCINE -today 03/04/2019   No changes today other than getting back on your prior efforts- I think you can be back to that 188 by follow up  Please stop by lab before you go If you do not have mychart- we will call you about results within 5 business days of [place] receiving them.  If you have mychart- we will send your results within 3 business days of [place] receiving them.  If abnormal or we want to clarify a result, we will call or mychart you to make sure you receive the message.  If you have questions or concerns or don't hear within 5-7 days, please send [place] a message or call us.

## 2019-05-08 NOTE — Progress Notes (Signed)
Phone: [phone]   Subjective:  Patient presents today for their annual physical. Chief complaint-noted.   See problem oriented charting- ROS- full  review of systems was completed and negative except for: dental problems, seasonal allergies, nervous/anxious.  The following were reviewed and entered/updated in epic: Past Medical History:  Diagnosis Date  . Cataract   . Chronic kidney disease   . Depression    stress  . Heart murmur    born with  . Hyperlipidemia    Patient Active Problem List   Diagnosis Date Noted  . Alopecia areata 10/27/2018    Priority: Medium  . Asthma 10/27/2018    Priority: Medium  . Genital herpes 05/07/2016    Priority: Medium  . CKD (chronic kidney disease), stage III 08/07/2014    Priority: Medium  . Hyperglycemia 08/07/2014    Priority: Medium  . Hyperlipidemia 04/02/2008    Priority: Medium  . Tendon rupture, nontraumatic, extensor 03/21/2015    Priority: Low  . Hormone replacement therapy 11/01/2014    Priority: Low  . Stress at work 08/07/2014    Priority: Low  . Injury of toenail 06/13/2014    Priority: Low  . Hematuria 05/02/2010    Priority: Low  . Strain of left pectoralis muscle 09/20/2015  . Chest pain 09/20/2015   Past Surgical History:  Procedure Laterality Date  . COLONOSCOPY    . COSMETIC SURGERY     facial  . fibroid tumors    . KNEE ARTHROSCOPY    . TONSILLECTOMY AND ADENOIDECTOMY     as a child  . WISDOM TOOTH EXTRACTION      Family History  Adopted: Yes  Problem Relation Age of Onset  . Alcohol abuse Mother        Patient was adopted and not much else known  . Colon cancer Neg Hx     Medications- reviewed and updated Current Outpatient Medications  Medication Sig Dispense Refill  . acyclovir (ZOVIRAX) 200 MG capsule TAKE 1 CAPSULE FIVE TIMES A DAY 25 capsule 3  . chlorhexidine (PERIDEX) 0.12 % solution SWISH 1 CAPFUL FOR 30 SECONDS BID THEN EXPECTORATE    . Multiple Vitamin (MULTIVITAMIN) tablet  Take 1 tablet by mouth daily.    . Omega-3 1000 MG CAPS Take 2,000 mg by mouth every morning.     . sertraline (ZOLOFT) 50 MG tablet Take 1 tablet (50 mg total) by mouth daily. 90 tablet 3  . simvastatin (ZOCOR) 40 MG tablet One by mouth daily or as directed. 90 tablet 3  . VITAMIN D-VITAMIN K PO Take by mouth. 1 tablet daily     No current facility-administered medications for this visit.     Allergies-reviewed and updated Allergies  Allergen Reactions  . Fentanyl Nausea Only  . Zocor [Simvastatin]     Muscle aches and pains    Social History   Social History Narrative   Married 2006 (together since 1984). No children.  Many pets (at least 20 legs at any given time)      Works for [employer] (promotion when went back in 2018). 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  Oropharynx normal Neck: no thyromegaly CV: RRR no murmurs rubs or gallops Lungs: CTAB no crackles, wheeze, rhonchi Abdomen: soft/nontender/nondistended/normal bowel sounds. No rebound or guarding.  Ext: no edema Skin: warm, dry Neuro: grossly normal, moves all extremities, PERRLA     Assessment and Plan   63 y.o. female presenting for annual physical.  Health Maintenance counseling:  1. Anticipatory guidance: Patient counseled regarding regular dental exams -q6 month- has to get some work done- will be 6 month process for implants, eye exams - q6 months,  avoiding smoking and second hand smoke , limiting alcohol to 1 beverage per day- slightly up with covid 19- encouraged to limit .   2. Risk factor reduction:  Advised patient of need for regular exercise and diet rich and fruits and vegetables to reduce risk of heart attack and stroke.  Exercise- so so lately without gym availability- trying to do some yardwork- rocks/tiling etc, does some walking. Diet-  Weight stable-197 last physical in 2018, her goal was to lose some- had gotten down to 188 and then covid happened.  Wt Readings from Last 3 Encounters:  05/09/19 198 lb (89.8 kg)  10/27/18 198 lb (89.8 kg)  01/20/18 193 lb 3.2 oz (87.6 kg)  3. Immunizations/screenings/ancillary studies-she has started Shingrix series.  Flu shot given today  Immunization History  Administered Date(s) Administered  . Influenza Split 04/22/2012  . Influenza,inj,Quad PF,6+ Mos 04/25/2013, 05/28/2014, 04/05/2015, 05/07/2016, 04/26/2017, 05/18/2018, 05/09/2019  . Influenza-Unspecified 06/01/2014  . Td 08/04/2003  . Tdap 08/07/2014  . Zoster Recombinat (Shingrix) 04/12/2019   4. Cervical cancer screening- follows with [facility] April 2019 with 3-year follow-up on file 5. Breast cancer screening-  breast exam with GYN and mammogram -August 2020 6. Colon cancer screening - 07/03/2016 with 10-year follow-up 7. Skin cancer screening- ended up transferring to Dr. Denna Haggard and has been better experience. advised regular sunscreen use. Denies worrisome, changing, or new skin lesions.  8. Birth control/STD check- postmenopausal and monogamous 9. Osteoporosis screening at 66- we will plan on this if not done by GYN. We will do this next year- she prefers through [place] -Never smoker- getting urine microscopic due to history hematuria though  Status of chronic or acute concerns  Hyperlipidemia- pt states she does experience leg cramps on simvastatin 40 mg every other day.  Takes omega-3 regularly-need to update lipid panel today. coq10 didn't help in the past.  Lab Results  Component Value Date   CHOL 210 (H) 06/10/2017   HDL 37.30 (L) 06/10/2017   LDLCALC 120 (H) 04/30/2016   LDLDIRECT 135.0 06/10/2017   TRIG 208.0 (H) 06/10/2017   CHOLHDL 6 06/10/2017    Hyperglycemia-last A1c was trending  down, update today . Did get weight down but has trended up Lab Results  Component Value Date   HGBA1C 5.7 06/10/2017   HGBA1C 6.0 08/07/2014   Stage 3a chronic kidney disease- we will monitor BMP today.  GFR has been in 73s. Recommended at least every 56-month checks.  Knows to avoid NSAIDs.we opted to hold off on ace-I with current BP and potential to worsen asthma which she says is mainly exercise induced  Mild intermittent asthma (she states exercise induced) without complication-no recent flareups.  Patient reported new diagnosis last visit-had not previously been on problem list.  Patient reports diagnosed by prior physician.  She has wheezing and shortness of breath at times but has declined albuterol. Long term issues with lung tightness over the years- no recent worsening- gets better  as recovers   Stress at work-patient compliant with sertraline 50 mg-finds this helpful   Hematuria, unspecified type- get urine microscopic. Released from urology unless symptoms worsened.    Alopecia areata- referred to [facility] dermatology last visit- she ende dup seeing Dr. Denna Haggard- was told aging related primarily- just thinning.   Genital herpes-has acyclovir on hand if needed   No diarrhea issues on metamucil- had previously thought related to statin  Recommended follow up: 6 months, definitely 1 year for physical   Lab/Order associations: fasting   ICD-10-CM   1. Preventative health care  Z00.00 Hemoglobin A1c    CBC    Lipid panel    Comprehensive metabolic panel    Urine Microscopic Only  2. Hyperlipidemia, unspecified hyperlipidemia type  E78.5 CBC    Lipid panel    Comprehensive metabolic panel  3. Hyperglycemia  R73.9 Hemoglobin A1c  4. Stage 3a chronic kidney disease  N18.31   5. Mild intermittent asthma without complication  [id number]   6. Stress at work  Z56.6   7. Need for immunization against influenza  Z23 Flu Vaccine QUAD 36+ mos IM  8. Hematuria, unspecified type  R31.9  Urine Microscopic Only    Meds ordered this encounter  Medications  . sertraline (ZOLOFT) 50 MG tablet    Sig: Take 1 tablet (50 mg total) by mouth daily.    Dispense:  90 tablet    Refill:  3  . simvastatin (ZOCOR) 40 MG tablet    Sig: One by mouth daily or as directed.    Dispense:  90 tablet    Refill:  3   Return precautions advised.  Garret Reddish, MD

## 2019-05-09 ENCOUNTER — Encounter: Payer: Self-pay | Admitting: Family Medicine

## 2019-05-09 ENCOUNTER — Ambulatory Visit (INDEPENDENT_AMBULATORY_CARE_PROVIDER_SITE_OTHER): Admitting: Family Medicine

## 2019-05-09 ENCOUNTER — Other Ambulatory Visit: Payer: Self-pay

## 2019-05-09 VITALS — BP 110/70 | HR 66 | Temp 98.1°F | Ht 67.0 in | Wt 198.0 lb

## 2019-05-09 DIAGNOSIS — R739 Hyperglycemia, unspecified: Secondary | ICD-10-CM | POA: Diagnosis not present

## 2019-05-09 DIAGNOSIS — N1831 Chronic kidney disease, stage 3a: Secondary | ICD-10-CM | POA: Diagnosis not present

## 2019-05-09 DIAGNOSIS — E785 Hyperlipidemia, unspecified: Secondary | ICD-10-CM

## 2019-05-09 DIAGNOSIS — J4520 Mild intermittent asthma, uncomplicated: Secondary | ICD-10-CM

## 2019-05-09 DIAGNOSIS — Z23 Encounter for immunization: Secondary | ICD-10-CM | POA: Diagnosis not present

## 2019-05-09 DIAGNOSIS — Z0000 Encounter for general adult medical examination without abnormal findings: Secondary | ICD-10-CM

## 2019-05-09 DIAGNOSIS — R319 Hematuria, unspecified: Secondary | ICD-10-CM

## 2019-05-09 DIAGNOSIS — Z566 Other physical and mental strain related to work: Secondary | ICD-10-CM

## 2019-05-09 LAB — COMPREHENSIVE METABOLIC PANEL
ALT: 24 U/L (ref 0–35)
AST: 22 U/L (ref 0–37)
Albumin: 4.7 g/dL (ref 3.5–5.2)
Alkaline Phosphatase: 71 U/L (ref 39–117)
BUN: 17 mg/dL (ref 6–23)
CO2: 27 mEq/L (ref 19–32)
Calcium: 9.6 mg/dL (ref 8.4–10.5)
Chloride: 104 mEq/L (ref 96–112)
Creatinine, Ser: 0.96 mg/dL (ref 0.40–1.20)
GFR: 58.41 mL/min — ABNORMAL LOW (ref >60.00)
Glucose, Bld: 108 mg/dL — ABNORMAL HIGH (ref 70–99)
Potassium: 4.1 mEq/L (ref 3.5–5.1)
Sodium: 139 mEq/L (ref 135–145)
Total Bilirubin: 0.5 mg/dL (ref 0.2–1.2)
Total Protein: 7 g/dL (ref 6.0–8.3)

## 2019-05-09 LAB — CBC
HCT: 40.2 % (ref 36.0–46.0)
Hemoglobin: 13.5 g/dL (ref 12.0–15.0)
MCHC: 33.7 g/dL (ref 30.0–36.0)
MCV: 90.7 fl (ref 78.0–100.0)
Platelets: 224 10*3/uL (ref 150.0–400.0)
RBC: 4.43 Mil/uL (ref 3.87–5.11)
RDW: 13.4 % (ref 11.5–15.5)
WBC: 4.5 10*3/uL (ref 4.0–10.5)

## 2019-05-09 LAB — LDL CHOLESTEROL, DIRECT: Direct LDL: 116 mg/dL

## 2019-05-09 LAB — LIPID PANEL
Cholesterol: 220 mg/dL — ABNORMAL HIGH (ref 0–200)
HDL: 37.9 mg/dL — ABNORMAL LOW (ref >39.00)
NonHDL: 182.47
Total CHOL/HDL Ratio: 6
Triglycerides: 254 mg/dL — ABNORMAL HIGH (ref 0.0–149.0)
VLDL: 50.8 mg/dL — ABNORMAL HIGH (ref 0.0–40.0)

## 2019-05-09 LAB — URINALYSIS, MICROSCOPIC ONLY: RBC / HPF: NONE SEEN (ref 0–?)

## 2019-05-09 LAB — HEMOGLOBIN A1C: Hgb A1c MFr Bld: 6 % (ref 4.6–6.5)

## 2019-05-09 MED ORDER — SIMVASTATIN 40 MG PO TABS: ORAL_TABLET | ORAL | 3 refills | Status: DC

## 2019-05-09 MED ORDER — SERTRALINE HCL 50 MG PO TABS: 50.0000 mg | ORAL_TABLET | Freq: Every day | ORAL | 3 refills | Status: DC

## 2019-05-11 ENCOUNTER — Other Ambulatory Visit: Payer: Self-pay

## 2019-05-11 DIAGNOSIS — R319 Hematuria, unspecified: Secondary | ICD-10-CM

## 2019-05-17 ENCOUNTER — Other Ambulatory Visit (INDEPENDENT_AMBULATORY_CARE_PROVIDER_SITE_OTHER)

## 2019-05-17 ENCOUNTER — Other Ambulatory Visit: Payer: Self-pay

## 2019-05-17 DIAGNOSIS — R319 Hematuria, unspecified: Secondary | ICD-10-CM | POA: Diagnosis not present

## 2019-05-17 LAB — URINALYSIS, MICROSCOPIC ONLY

## 2019-06-18 ENCOUNTER — Other Ambulatory Visit: Payer: Self-pay | Admitting: Family Medicine

## 2019-10-20 ENCOUNTER — Ambulatory Visit: Payer: TRICARE For Life (TFL) | Attending: Internal Medicine

## 2019-10-20 DIAGNOSIS — Z23 Encounter for immunization: Secondary | ICD-10-CM

## 2019-10-20 NOTE — Progress Notes (Signed)
   Covid-19 Vaccination Clinic  Name:  Amber Jacobs    MRN: [id number] DOB: Oct 18, 1955  10/20/2019  [name] was observed post Covid-19 immunization for 15 minutes without incident. She was provided with Vaccine Information Sheet and instruction to access the V-Safe system.   [name] was instructed to call 911 with any severe reactions post vaccine: Marland Kitchen Difficulty breathing  . Swelling of face and throat  . A fast heartbeat  . A bad rash all over body  . Dizziness and weakness   Immunizations Administered    Name Date Dose VIS Date Route   Pfizer COVID-19 Vaccine 10/20/2019 10:27 AM 0.3 mL 07/14/2019 Intramuscular   Manufacturer: [place]   Lot: [id number]   [place]: [id number]

## 2019-11-14 ENCOUNTER — Ambulatory Visit: Payer: TRICARE For Life (TFL) | Attending: Internal Medicine

## 2019-11-14 DIAGNOSIS — Z23 Encounter for immunization: Secondary | ICD-10-CM

## 2019-11-14 NOTE — Progress Notes (Signed)
   Covid-19 Vaccination Clinic  Name:  Amber Jacobs    MRN: [id number] DOB: [birth year]  11/14/2019  [name] was observed post Covid-19 immunization for 15 minutes without incident. She was provided with Vaccine Information Sheet and instruction to access the V-Safe system.   [name] was instructed to call 911 with any severe reactions post vaccine: Marland Kitchen Difficulty breathing  . Swelling of face and throat  . A fast heartbeat  . A bad rash all over body  . Dizziness and weakness   Immunizations Administered    Name Date Dose VIS Date Route   Pfizer COVID-19 Vaccine 11/14/2019 11:31 AM 0.3 mL 07/14/2019 Intramuscular   Manufacturer: [place]   Lot: [id number]   [place]: [id number]

## 2020-03-23 ENCOUNTER — Other Ambulatory Visit: Payer: Self-pay | Admitting: Family Medicine

## 2020-04-25 ENCOUNTER — Encounter: Payer: Self-pay | Admitting: Family Medicine

## 2020-04-25 ENCOUNTER — Ambulatory Visit (INDEPENDENT_AMBULATORY_CARE_PROVIDER_SITE_OTHER): Payer: Medicare Other

## 2020-04-25 ENCOUNTER — Other Ambulatory Visit: Payer: Self-pay

## 2020-04-25 DIAGNOSIS — Z23 Encounter for immunization: Secondary | ICD-10-CM | POA: Diagnosis not present

## 2020-05-17 ENCOUNTER — Other Ambulatory Visit: Payer: Self-pay | Admitting: Obstetrics & Gynecology

## 2020-05-17 DIAGNOSIS — R928 Other abnormal and inconclusive findings on diagnostic imaging of breast: Secondary | ICD-10-CM

## 2020-05-27 ENCOUNTER — Other Ambulatory Visit: Payer: Self-pay

## 2020-05-27 ENCOUNTER — Ambulatory Visit: Payer: TRICARE For Life (TFL)

## 2020-05-27 ENCOUNTER — Ambulatory Visit
Admission: RE | Admit: 2020-05-27 | Discharge: 2020-05-27 | Disposition: A | Discharge status: Home / Self Care | Payer: Medicare Other | Source: Ambulatory Visit | Attending: Obstetrics & Gynecology | Admitting: Obstetrics & Gynecology

## 2020-05-27 DIAGNOSIS — R928 Other abnormal and inconclusive findings on diagnostic imaging of breast: Secondary | ICD-10-CM

## 2020-06-13 ENCOUNTER — Other Ambulatory Visit: Payer: Self-pay | Admitting: Family Medicine

## 2020-07-10 ENCOUNTER — Telehealth: Payer: Self-pay

## 2020-07-10 ENCOUNTER — Encounter: Payer: Self-pay | Admitting: Family Medicine

## 2020-07-10 ENCOUNTER — Ambulatory Visit: Payer: Medicare Other | Admitting: Physician Assistant

## 2020-07-10 ENCOUNTER — Other Ambulatory Visit: Payer: Self-pay

## 2020-07-10 ENCOUNTER — Ambulatory Visit (INDEPENDENT_AMBULATORY_CARE_PROVIDER_SITE_OTHER): Payer: Medicare Other | Admitting: Family Medicine

## 2020-07-10 VITALS — BP 142/82 | HR 75 | Temp 98.2°F | Ht 67.0 in | Wt 195.4 lb

## 2020-07-10 DIAGNOSIS — E785 Hyperlipidemia, unspecified: Secondary | ICD-10-CM | POA: Diagnosis not present

## 2020-07-10 DIAGNOSIS — R739 Hyperglycemia, unspecified: Secondary | ICD-10-CM

## 2020-07-10 DIAGNOSIS — Z23 Encounter for immunization: Secondary | ICD-10-CM

## 2020-07-10 DIAGNOSIS — J4520 Mild intermittent asthma, uncomplicated: Secondary | ICD-10-CM

## 2020-07-10 DIAGNOSIS — N1831 Chronic kidney disease, stage 3a: Secondary | ICD-10-CM

## 2020-07-10 DIAGNOSIS — Z780 Asymptomatic menopausal state: Secondary | ICD-10-CM | POA: Diagnosis not present

## 2020-07-10 DIAGNOSIS — R202 Paresthesia of skin: Secondary | ICD-10-CM | POA: Diagnosis not present

## 2020-07-10 NOTE — Patient Instructions (Addendum)
Health Maintenance Due  Topic Date Due  . DEXA SCAN - see below Never done  . Pneumovax 23 today Never done   Schedule a lab visit at the check out desk within 2 weeks. Return for future fasting labs meaning nothing but water after midnight please. Ok to take your medications with water.   Monitor blood pressure at home- suspect this will come right back down  Schedule your bone density test at check out desk. You may also call directly to X-ray at [phone] to schedule an appointment that is convenient for you.  - located [address] across the [address] from [place] - in the basement - you do need an appointment for the bone density tests.   Recommended follow up: keep January visit

## 2020-07-10 NOTE — Progress Notes (Signed)
Phone [phone] In person visit   Subjective:   Amber Jacobs is a 64 y.o. year old very pleasant female patient who presents for/with See problem oriented charting Chief Complaint  Patient presents with  . Bilateral Leg Tingling    Constant tingling, Pt states that she had a very bad tick bite back in october she didn't see anybody about x60 days    This visit occurred during the SARS-CoV-2 public health emergency.  Safety protocols were in place, including screening questions prior to the visit, additional usage of staff PPE, and extensive cleaning of exam room while observing appropriate contact time as indicated for disinfecting solutions.   Past Medical History-  Patient Active Problem List   Diagnosis Date Noted  . Alopecia areata 10/27/2018    Priority: Medium  . Asthma 10/27/2018    Priority: Medium  . Genital herpes 05/07/2016    Priority: Medium  . CKD (chronic kidney disease), stage III ([place]) 08/07/2014    Priority: Medium  . Hyperglycemia 08/07/2014    Priority: Medium  . Hyperlipidemia 04/02/2008    Priority: Medium  . Tendon rupture, nontraumatic, extensor 03/21/2015    Priority: Low  . Hormone replacement therapy 11/01/2014    Priority: Low  . Stress at work 08/07/2014    Priority: Low  . Injury of toenail 06/13/2014    Priority: Low  . Hematuria 05/02/2010    Priority: Low  . Strain of left pectoralis muscle 09/20/2015  . Chest pain 09/20/2015    Medications- reviewed and updated Current Outpatient Medications  Medication Sig Dispense Refill  . acyclovir (ZOVIRAX) 200 MG capsule TAKE 1 CAPSULE FIVE TIMES A DAY 25 capsule 3  . COLLAGEN PO Take by mouth.    . Multiple Vitamin (MULTIVITAMIN) tablet Take 1 tablet by mouth daily.    . Omega-3 1000 MG CAPS Take 2,000 mg by mouth every morning.     . sertraline (ZOLOFT) 50 MG tablet TAKE 1 TABLET DAILY 90 tablet 3  . simvastatin (ZOCOR) 40 MG tablet TAKE 1 TABLET DAILY OR AS DIRECTED 90 tablet 3  .  VITAMIN D-VITAMIN K PO Take by mouth. 1 tablet daily    . chlorhexidine (PERIDEX) 0.12 % solution SWISH 1 CAPFUL FOR 30 SECONDS BID THEN EXPECTORATE (Patient not taking: Reported on 07/10/2020)     No current facility-administered medications for this visit.     Objective:  BP (!) 142/82   Pulse 75   Temp 98.2 F (36.8 C) (Temporal)   Ht 5\' 7"  (1.702 m)   Wt 195 lb 6.4 oz (88.6 kg)   SpO2 97%   BMI 30.60 kg/m  Gen: NAD, resting comfortably CV: RRR no murmurs rubs or gallops Lungs: CTAB no crackles, wheeze, rhonchi Abdomen: soft/nontender/nondistended/normal bowel sounds. No rebound or guarding.   Ext: no edema, 2+ PT and DP pulses Skin: warm, dry Neuro: Intact to gross touch and monofilament examination on bilateral feet-perhaps slightly decreased on left foot    Assessment and Plan   # Bilateral leg tingling S:sypmtoms started in right leg about 60 days ago. Seemed to progress some at first and now stabilized. Slight sensation of heaviness. Left leg crept in sometime after the right. More pronounced on the right side still. Constant- not worse or better with position changes. Heat or cold does not affect this. Tingling feeling like before she would get a herpes outbreak.   Does have history of prediabetes. She has cut out added sugars, cut back red sugars, tried to go  more plant based. She has increased exercise. 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 Is doing a lot of standing on marble 4-6 hours at a time and start coincides with symptoms some. Does inversion table for back and no back issues reported.   She has some numbness in her left lower leg after prior surgery which has been unchanged  Also had tick bite in October. No target lesion or systemic symptoms at that time on stomach. Happened in [place].  A lot of deer in her area.  Lab Results  Component Value Date   HGBA1C 6.0 05/09/2019    A/P:  64 year old female with approximately 2 months of bilateral lower extremely tingling concerning for neuropathy.  Denies significant back pain issues-we opted not to pursue imaging of low back.  We did discuss updating some blood work including A1c, TSH, B12-she is in agreement for these.  I do not think issues are related to prior tick bite  #hyperlipidemia S: Medication: Simvastatin 40 mg Lab Results  Component Value Date   CHOL 220 (H) 05/09/2019   HDL 37.90 (L) 05/09/2019   LDLCALC 120 (H) 04/30/2016   LDLDIRECT 116.0 05/09/2019   TRIG 254.0 (H) 05/09/2019   CHOLHDL 6 05/09/2019   A/P: patient agrees to come back for lipid panel fasting.  Peak LDL was 213 and considering this is for primary prevention of 30% to 50% reduction is reasonable-reasonable control last check-continue current medication for now   # Hyperglycemia/insulin resistance/prediabetes S:  Medication: None Lab Results  Component Value Date   HGBA1C 6.0 05/09/2019   HGBA1C 5.7 06/10/2017   HGBA1C 6.0 08/07/2014    A/P: As above want to check on A1c to see if contributing to neuropathy.  Would like to push levels down with intensified lifestyle changes-closer to 6.5 could consider metformin.  Weight is down 3 pounds from last year   #CKD stage III S: Stable in 50s for the most part A/P: Hopefully stable-update CMP with labs.  Should avoid NSAIDs.  Blood pressure above goal for someone CKD stage III-she reports 107/64 at home last week.  She was  frustrated with our handling of her visit today/triage and we think this may have contributed to elevated levels today-she will do some home monitoring and let me know if fails to improve.  #Shortness of breath with history of asthma -one of the issues with triage was report of baseline shortness of breath.  Today she reports Baseline shortness of breath that has not changed in years. Diagnosis of asthma on problem list. Can climb 3 flights of stairs without issues.  I do not  suspect pulmonary embolism or DVT (no calf pain or swelling).  Overall asthma symptoms appear well controlled-patient is not interested in having inhaler on hand for stability of symptoms-continue to monitor  Recommended follow up: Keep January welcome to Medicare visit Future Appointments  Date Time Provider [place]  07/11/2020  8:15 AM [facility]  08/29/2020  1:20 PM Marin Olp, MD [facility]    Lab/Order associations:   ICD-10-CM   1. Hyperglycemia  R73.9 Hemoglobin A1c  2. Paresthesia  R20.2 TSH    Vitamin B12  3. Hyperlipidemia, unspecified hyperlipidemia type  E78.5 CBC With Differential/Platelet    COMPLETE METABOLIC PANEL WITH GFR    Lipid Panel w/reflex Direct LDL  4. Postmenopausal  Z78.0 DG Bone Density  5. Stage 3a chronic kidney  disease ([place])  N18.31   6. Mild intermittent asthma without complication  P95.58     Return precautions advised.  Garret Reddish, MD

## 2020-07-10 NOTE — Telephone Encounter (Signed)
Called pt to let her know, even though the triage nurse thought a virtual visit at 4 today was fine, that our providers were not okay with her waiting that long and to go to ED. She said that would be an overreaction to go to the ED.Pt believes this could be possibly her bloodsugar is high or a nerve compressed. She states that she is very minimally out of breath when she goes up the stairs. Nothing like her husband who has CHF when he goes up the stairs and has to stop to breath. Pt just wants a blood panel done. I told pt our concern of a PE and its symptoms. 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 I told Aldona Bar, who her virtual visit was scheduled with, that the pt would not go to ED. Aldona Bar said tell pt to come in to office at 3:30. Pt agreed and said thank you for the concern. Pt will be here at 3:30.

## 2020-07-10 NOTE — Telephone Encounter (Signed)
Pt is looking to schedule a bone density test the week after christmas if possible.

## 2020-07-10 NOTE — Telephone Encounter (Signed)
Spoke to pt told her Aldona Bar discussed your symptoms with Dr. Yong Channel and they are both agree that you should go to the ED to be evaluated. Pt said that is not happening. This has been going on for 60 days and no real change. Told pt that she could possible have a blood clot in her lung with the SOB. Pt said she is not SOB just a little winded, she knows what SOB is her husband has COPD. Pt said she will be here at 3:30 to see [place]. Told pt any change in symptoms need to go to the ED. Pt verbalized understanding and appreciated our concern but will see [place] later.

## 2020-07-11 ENCOUNTER — Other Ambulatory Visit: Payer: Medicare Other

## 2020-07-11 DIAGNOSIS — R202 Paresthesia of skin: Secondary | ICD-10-CM

## 2020-07-11 DIAGNOSIS — R739 Hyperglycemia, unspecified: Secondary | ICD-10-CM

## 2020-07-11 DIAGNOSIS — E785 Hyperlipidemia, unspecified: Secondary | ICD-10-CM

## 2020-07-11 NOTE — Telephone Encounter (Signed)
Patient has been scheduled

## 2020-07-12 LAB — COMPLETE METABOLIC PANEL WITH GFR
AG Ratio: 2 (calc) (ref 1.0–2.5)
ALT: 21 U/L (ref 6–29)
AST: 22 U/L (ref 10–35)
Albumin: 4.5 g/dL (ref 3.6–5.1)
Alkaline phosphatase (APISO): 61 U/L (ref 37–153)
BUN/Creatinine Ratio: 18 (calc) (ref 6–22)
BUN: 18 mg/dL (ref 7–25)
CO2: 27 mmol/L (ref 20–32)
Calcium: 9.5 mg/dL (ref 8.6–10.4)
Chloride: 107 mmol/L (ref 98–110)
Creat: 1.02 mg/dL — ABNORMAL HIGH (ref 0.50–0.99)
GFR, Est African American: 67 mL/min/{1.73_m2} (ref >=60)
GFR, Est Non African American: 58 mL/min/{1.73_m2} — ABNORMAL LOW (ref >=60)
Globulin: 2.3 g/dL (calc) (ref 1.9–3.7)
Glucose, Bld: 102 mg/dL — ABNORMAL HIGH (ref 65–99)
Potassium: 4.4 mmol/L (ref 3.5–5.3)
Sodium: 140 mmol/L (ref 135–146)
Total Bilirubin: 0.4 mg/dL (ref 0.2–1.2)
Total Protein: 6.8 g/dL (ref 6.1–8.1)

## 2020-07-12 LAB — CBC WITH DIFFERENTIAL/PLATELET
Absolute Monocytes: 359 cells/uL (ref 200–950)
Basophils Absolute: 32 cells/uL (ref 0–200)
Basophils Relative: 0.7 %
Eosinophils Absolute: 110 cells/uL (ref 15–500)
Eosinophils Relative: 2.4 %
HCT: 39.9 % (ref 35.0–45.0)
Hemoglobin: 13.7 g/dL (ref 11.7–15.5)
Lymphs Abs: 1776 cells/uL (ref 850–3900)
MCH: 31.4 pg (ref 27.0–33.0)
MCHC: 34.3 g/dL (ref 32.0–36.0)
MCV: 91.3 fL (ref 80.0–100.0)
MPV: 9.7 fL (ref 7.5–12.5)
Monocytes Relative: 7.8 %
Neutro Abs: 2323 cells/uL (ref 1500–7800)
Neutrophils Relative %: 50.5 %
Platelets: 241 10*3/uL (ref 140–400)
RBC: 4.37 10*6/uL (ref 3.80–5.10)
RDW: 12.6 % (ref 11.0–15.0)
Total Lymphocyte: 38.6 %
WBC: 4.6 10*3/uL (ref 3.8–10.8)

## 2020-07-12 LAB — HEMOGLOBIN A1C
Hgb A1c MFr Bld: 5.6 % of total Hgb (ref <5.7)
Mean Plasma Glucose: 114 mg/dL
eAG (mmol/L): 6.3 mmol/L

## 2020-07-12 LAB — LIPID PANEL W/REFLEX DIRECT LDL
Cholesterol: 163 mg/dL (ref <200)
HDL: 44 mg/dL — ABNORMAL LOW (ref >=50)
LDL Cholesterol (Calc): 100 mg/dL (calc) — ABNORMAL HIGH
Non-HDL Cholesterol (Calc): 119 mg/dL (calc) (ref <130)
Total CHOL/HDL Ratio: 3.7 (calc) (ref <5.0)
Triglycerides: 99 mg/dL (ref <150)

## 2020-07-12 LAB — TSH: TSH: 1.84 mIU/L (ref 0.40–4.50)

## 2020-07-12 LAB — VITAMIN B12: Vitamin B-12: 944 pg/mL (ref 200–1100)

## 2020-07-13 ENCOUNTER — Encounter: Payer: Self-pay | Admitting: Family Medicine

## 2020-07-15 ENCOUNTER — Other Ambulatory Visit: Payer: Self-pay

## 2020-07-15 ENCOUNTER — Encounter: Payer: Self-pay | Admitting: Neurology

## 2020-07-15 DIAGNOSIS — R202 Paresthesia of skin: Secondary | ICD-10-CM

## 2020-08-06 ENCOUNTER — Other Ambulatory Visit: Payer: Self-pay

## 2020-08-06 ENCOUNTER — Ambulatory Visit (INDEPENDENT_AMBULATORY_CARE_PROVIDER_SITE_OTHER)
Admission: RE | Admit: 2020-08-06 | Discharge: 2020-08-06 | Disposition: A | Discharge status: Home / Self Care | Payer: Medicare Other | Source: Ambulatory Visit | Attending: Family Medicine | Admitting: Family Medicine

## 2020-08-06 DIAGNOSIS — Z780 Asymptomatic menopausal state: Secondary | ICD-10-CM | POA: Diagnosis not present

## 2020-08-29 ENCOUNTER — Ambulatory Visit (INDEPENDENT_AMBULATORY_CARE_PROVIDER_SITE_OTHER): Payer: Medicare Other | Admitting: Family Medicine

## 2020-08-29 ENCOUNTER — Encounter: Payer: Self-pay | Admitting: Family Medicine

## 2020-08-29 ENCOUNTER — Other Ambulatory Visit: Payer: Self-pay

## 2020-08-29 VITALS — BP 124/78 | HR 72 | Temp 97.8°F | Ht 67.0 in | Wt 198.8 lb

## 2020-08-29 DIAGNOSIS — Z0000 Encounter for general adult medical examination without abnormal findings: Secondary | ICD-10-CM | POA: Diagnosis not present

## 2020-08-29 DIAGNOSIS — E785 Hyperlipidemia, unspecified: Secondary | ICD-10-CM

## 2020-08-29 NOTE — Patient Instructions (Addendum)
  Amber Jacobs , Thank you for taking time to come for your Medicare Wellness Visit. I appreciate your ongoing commitment to your health goals. Please review the following plan we discussed and let me know if I can assist you in the future.   These are the goals we discussed: 1. Continue 4 days a week exercise 2. For reducing portion size- smaller plates, preportioining   This is a list of the screening recommended for you and due dates:  Health Maintenance  Topic Date Due  .  Hepatitis C: One time screening is recommended by Center for Disease Control  (CDC) for  adults born from [birth year] through 1966.   07/31/2098*  . HIV Screening  07/31/2098*  . Pap Smear  11/01/2020  . Pneumonia vaccines (2 of 2 - PCV13) 07/10/2021  . Mammogram  05/27/2022  . Tetanus Vaccine  08/07/2024  . Colon Cancer Screening  07/03/2026  . Flu Shot  Completed  . DEXA scan (bone density measurement)  Completed  . COVID-19 Vaccine  Completed  *Topic was postponed. The date shown is not the original due date.   Recommended follow up: Return in about 6 months (around 02/26/2021) for follow up- or sooner if needed.

## 2020-08-29 NOTE — Progress Notes (Signed)
Phone: [phone]   Subjective:  Patient presents today for their Welcome to Medicare Exam    Preventive Screening-Counseling & Management  Vision screen:   Hearing Screening   125Hz  250Hz  500Hz  1000Hz  2000Hz  3000Hz  4000Hz  6000Hz  8000Hz   Right ear:           Left ear:             Visual Acuity Screening   Right eye Left eye Both eyes  Without correction:     With correction: 20/30 20/20 20/20     Advanced directives: HCPOA Husband. Full code on living will.   Modifiable Risk Factors/behavioral risk assessment/psychosocial risk assessment Regular exercise: joined [employer]- 4x a week at present Diet: feels could cut down on portoin size  Wt Readings from Last 3 Encounters:  08/29/20 198 lb 12.8 oz (90.2 kg)  07/10/20 195 lb 6.4 oz (88.6 kg)  05/09/19 198 lb (89.8 kg)  Smoking Status: Never Smoker Second Hand Smoking status: No smokers in home Alcohol intake: 4 per week- low risk  Cardiac risk factors:  advanced age (older than 73 for men, 26 for women)  Treated Hyperlipidemia  no Hypertension  No diabetes. Last a1c was significantly improved Lab Results  Component Value Date   HGBA1C 5.6 07/11/2020  Family History: unknown   Depression Screen/risk evaluation Risk factors: current pandemic. PHQ2 0  Depression screen [facility] 2/9 08/29/2020 07/10/2020 05/09/2019 10/27/2018  Decreased Interest 0 0 0 0  Down, Depressed, Hopeless 0 0 0 0  PHQ - 2 Score 0 0 0 0    Functional ability and level of safety Mobility assessment:  timed get up and go <12 seconds Activities of Daily Living- Independent in ADLs (toileting, bathing, dressing, transferring, eating) and in IADLs (shopping, housekeeping, managing own medications, and handling finances) Home Safety: Loose rugs (a few- discussed adjusting), smoke detectors (up to date), small pets (yes but no falls associated with pets), grab bars (5 showers and 2 have grab bars), stairs (1 staircase 13 stairs has railings), life-alert system  (would use cell phone) Hearing Difficulties: -patient declines Fall Risk: None  Fall Risk  08/29/2020  Falls in the past year? 0  Number falls in past yr: 0  Injury with Fall? 0   Opioid use history:  no long term opioids use Self assessment of health status: "good"  Required Immunizations needed today:  Fully up to date Immunization History  Administered Date(s) Administered  . Fluad Quad(high Dose 65+) 04/25/2020  . Influenza Split 04/22/2012  . Influenza,inj,Quad PF,6+ Mos 04/25/2013, 05/28/2014, 04/05/2015, 05/07/2016, 04/26/2017, 05/18/2018, 05/09/2019  . Influenza-Unspecified 06/01/2014  . PFIZER(Purple Top)SARS-COV-2 Vaccination 10/20/2019, 11/14/2019, 03/25/2020  . Pneumococcal Polysaccharide-23 07/10/2020  . Td 08/04/2003  . Tdap 08/07/2014  . Zoster Recombinat (Shingrix) 04/12/2019   Health Maintenance  Topic Date Due  .  Hepatitis C: One time screening is recommended by Center for Disease Control  (CDC) for  adults born from [birth year] through 1966.   07/31/2098*  . HIV Screening  07/31/2098*  . Pap Smear  11/01/2020  . Pneumonia vaccines (2 of 2 - PCV13) 07/10/2021  . Mammogram  05/27/2022  . Tetanus Vaccine  08/07/2024  . Colon Cancer Screening  07/03/2026  . Flu Shot  Completed  . DEXA scan (bone density measurement)  Completed  . COVID-19 Vaccine  Completed  *Topic was postponed. The date shown is not the original due date.   Screening tests-  1. Colon cancer screening- 07/03/16 with 10 year follow up  2. Lung Cancer  screening- not a candidate 3. Skin cancer screening- Dr. Denna Haggard of dermatology 4. Cervical cancer screening- [facility]- up to date and now past formal age based screening requirements 5. Breast cancer screening- with gynecology and mammogram 05/27/20  The following were reviewed and entered/updated in epic: Past Medical History:  Diagnosis Date  . Cataract   . Chronic kidney disease   . Depression    stress  . Heart murmur    born  with  . Hyperlipidemia    Patient Active Problem List   Diagnosis Date Noted  . Alopecia areata 10/27/2018    Priority: Medium  . Asthma 10/27/2018    Priority: Medium  . Genital herpes 05/07/2016    Priority: Medium  . CKD (chronic kidney disease), stage III ([place]) 08/07/2014    Priority: Medium  . Hyperglycemia 08/07/2014    Priority: Medium  . Hyperlipidemia 04/02/2008    Priority: Medium  . Tendon rupture, nontraumatic, extensor 03/21/2015    Priority: Low  . Hormone replacement therapy 11/01/2014    Priority: Low  . Stress at work 08/07/2014    Priority: Low  . Injury of toenail 06/13/2014    Priority: Low  . Hematuria 05/02/2010    Priority: Low  . Strain of left pectoralis muscle 09/20/2015  . Chest pain 09/20/2015   Past Surgical History:  Procedure Laterality Date  . COLONOSCOPY    . COSMETIC SURGERY     facial  . fibroid tumors    . KNEE ARTHROSCOPY    . TONSILLECTOMY AND ADENOIDECTOMY     as a child  . WISDOM TOOTH EXTRACTION      Family History  Adopted: Yes  Problem Relation Age of Onset  . Alcohol abuse Mother        Patient was adopted and not much else known  . Colon cancer Neg Hx    Medications- reviewed and updated Current Outpatient Medications  Medication Sig Dispense Refill  . COLLAGEN PO Take by mouth.    . Multiple Vitamin (MULTIVITAMIN) tablet Take 1 tablet by mouth daily.    . Omega-3 1000 MG CAPS Take 2,000 mg by mouth every morning.     . sertraline (ZOLOFT) 50 MG tablet TAKE 1 TABLET DAILY 90 tablet 3  . simvastatin (ZOCOR) 40 MG tablet TAKE 1 TABLET DAILY OR AS DIRECTED 90 tablet 3  . VITAMIN D-VITAMIN K PO Take by mouth. 1 tablet daily     No current facility-administered medications for this visit.   Allergies-reviewed and updated Allergies  Allergen Reactions  . Fentanyl Nausea Only  . Zocor [Simvastatin]     Muscle aches and pains    Social History   Socioeconomic History  . Marital status: Married    Spouse  name: Not on file  . Number of children: Not on file  . Years of education: Not on file  . Highest education level: Not on file  Occupational History  . Not on file  Tobacco Use  . Smoking status: Never Smoker  . Smokeless tobacco: Never Used  Substance and Sexual Activity  . Alcohol use: Yes    Alcohol/week: 2.0 standard drinks    Types: 2 Glasses of wine per week  . Drug use: Yes    Comment: occasional marijuana perhaps once a month-goes to [place]  . Sexual activity: Not on file  Other Topics Concern  . Not on file  Social History Narrative   Married 2006 (together since 1984). No children.  Many pets (at  least 20 legs at any given time)      Retiring in 2022 from [employer]      Hobbies: gardener, gym, excellent chef   Social Determinants of Health   Financial Resource Strain: Low Risk   . Difficulty of Paying Living Expenses: Not hard at all  Food Insecurity: No Food Insecurity  . Worried About Charity fundraiser in the Last Year: Never true  . Ran Out of Food in the Last Year: Never true  Transportation Needs: No Transportation Needs  . Lack of Transportation (Medical): No  . Lack of Transportation (Non-Medical): No  Physical Activity: Sufficiently Active  . Days of Exercise per Week: 4 days  . Minutes of Exercise per Session: 50 min  Stress: No Stress Concern Present  . Feeling of Stress : Not at all  Social Connections: Socially Integrated  . Frequency of Communication with Friends and Family: More than three times a week  . Frequency of Social Gatherings with Friends and Family: Twice a week  . Attends Religious Services: More than 4 times per year  . Active Member of Clubs or Organizations: Yes  . Attends Archivist Meetings: More than 4 times per year  . Marital Status: Married   Objective  Objective:  BP 124/78   Pulse 72   Temp 97.8 F (36.6 C) (Temporal)   Ht 5\' 7"  (1.702 m)   Wt 198 lb 12.8 oz (90.2 kg)   SpO2 97%    BMI 31.14 kg/m  Gen: NAD, resting comfortably HEENT: Mucous membranes are moist. Oropharynx normal Neck: no thyromegaly CV: RRR no murmurs rubs or gallops Lungs: CTAB no crackles, wheeze, rhonchi Abdomen: soft/nontender/nondistended/normal bowel sounds. No rebound or guarding.  Ext: no edema Skin: warm, dry Neuro: grossly normal, moves all extremities, PERRLA   Assessment and Plan:   Welcome to Medicare exam completed-  1. Educated, counseled and referred based on above elements 2. Educated, counseled and referred as appropriate for preventative needs 3. Discussed and documented a written plan for preventiative services and screenings with personalized health advice- After Visit Summary was given to patient which included this plan  4. EKG offered [id number]- patient desires  EKG: sinus rhythm with rate 71, normal axis, normal intervals, no hypertrophy, no st or t wave changes, stable incomplete right bundle branch block with rsr' in v2  Status of chronic or acute concerns  #paresthesias reported last visit bilateral leg tingling- likely going to cancel with Dr. Posey Pronto- did weighted teeter hangup with weight on shoulders and this resolved issues in her legs.  #hyperlipidemia S: Medication:simvastatin 40mg   Lab Results  Component Value Date   CHOL 163 07/11/2020   HDL 44 (L) 07/11/2020   LDLCALC 100 (H) 07/11/2020   LDLDIRECT 116.0 05/09/2019   TRIG 99 07/11/2020   CHOLHDL 3.7 07/11/2020   A/P: patient has had issues with higher doses of statins and even simvastatin itself in past- we will continue current rx-peak LDL was 213 and for primary prevention improvement is excellent  # Hyperglycemia/insulin resistance/prediabetes S:  Medication: none Lab Results  Component Value Date   HGBA1C 5.6 07/11/2020   HGBA1C 6.0 05/09/2019   HGBA1C 5.7 06/10/2017   A/P: a1c much improved last viist- continue current medications  #Chronic kidney disease stage III S: GFR is typically in  the 50srange - avoid NSAIDs  A/P: stable on last check- continue to monitor- BP looks muc better compared to last visit  #asthma well controlled with  prn albuterol  Recommended follow up: 6 months follow up Future Appointments  Date Time Provider [place]  10/17/2020 10:50 AM Narda Amber K, DO [facility] None      Lab/Order associations: No diagnosis found.  No orders of the defined types were placed in this encounter.   Return precautions advised. Garret Reddish, MD

## 2020-10-11 ENCOUNTER — Ambulatory Visit: Payer: Medicare Other | Admitting: Neurology

## 2020-10-17 ENCOUNTER — Ambulatory Visit: Payer: Medicare Other | Admitting: Neurology

## 2020-11-06 ENCOUNTER — Encounter: Payer: Self-pay | Admitting: Family Medicine

## 2020-12-26 ENCOUNTER — Ambulatory Visit: Payer: Medicare Other | Attending: Internal Medicine

## 2020-12-26 DIAGNOSIS — Z20822 Contact with and (suspected) exposure to covid-19: Secondary | ICD-10-CM

## 2020-12-27 LAB — SARS-COV-2, NAA 2 DAY TAT

## 2020-12-27 LAB — NOVEL CORONAVIRUS, NAA: SARS-CoV-2, NAA: NOT DETECTED

## 2021-02-27 ENCOUNTER — Ambulatory Visit: Payer: Medicare Other | Admitting: Family Medicine

## 2021-03-13 ENCOUNTER — Other Ambulatory Visit: Payer: Self-pay

## 2021-03-13 ENCOUNTER — Encounter: Payer: Self-pay | Admitting: Family Medicine

## 2021-03-13 ENCOUNTER — Ambulatory Visit (INDEPENDENT_AMBULATORY_CARE_PROVIDER_SITE_OTHER): Payer: Medicare Other | Admitting: Family Medicine

## 2021-03-13 VITALS — BP 129/82 | HR 64 | Temp 98.2°F | Ht 67.0 in | Wt 192.8 lb

## 2021-03-13 DIAGNOSIS — E785 Hyperlipidemia, unspecified: Secondary | ICD-10-CM

## 2021-03-13 DIAGNOSIS — R739 Hyperglycemia, unspecified: Secondary | ICD-10-CM

## 2021-03-13 DIAGNOSIS — I8391 Asymptomatic varicose veins of right lower extremity: Secondary | ICD-10-CM

## 2021-03-13 DIAGNOSIS — N1831 Chronic kidney disease, stage 3a: Secondary | ICD-10-CM

## 2021-03-13 DIAGNOSIS — F325 Major depressive disorder, single episode, in full remission: Secondary | ICD-10-CM | POA: Diagnosis not present

## 2021-03-13 LAB — COMPREHENSIVE METABOLIC PANEL
ALT: 20 U/L (ref 0–35)
AST: 21 U/L (ref 0–37)
Albumin: 4.7 g/dL (ref 3.5–5.2)
Alkaline Phosphatase: 64 U/L (ref 39–117)
BUN: 16 mg/dL (ref 6–23)
CO2: 26 mEq/L (ref 19–32)
Calcium: 9.5 mg/dL (ref 8.4–10.5)
Chloride: 104 mEq/L (ref 96–112)
Creatinine, Ser: 0.95 mg/dL (ref 0.40–1.20)
GFR: 62.49 mL/min (ref >60.00)
Glucose, Bld: 97 mg/dL (ref 70–99)
Potassium: 4 mEq/L (ref 3.5–5.1)
Sodium: 140 mEq/L (ref 135–145)
Total Bilirubin: 0.5 mg/dL (ref 0.2–1.2)
Total Protein: 7.3 g/dL (ref 6.0–8.3)

## 2021-03-13 LAB — HEMOGLOBIN A1C: Hgb A1c MFr Bld: 6 % (ref 4.6–6.5)

## 2021-03-13 MED ORDER — SIMVASTATIN 40 MG PO TABS: ORAL_TABLET | ORAL | 3 refills | Status: DC

## 2021-03-13 NOTE — Patient Instructions (Addendum)
Health Maintenance Due  Topic Date Due   Zoster Vaccines- Shingrix (2 of 2) Patient will call with dates.  06/07/2019   COVID-19 Vaccine (4 - Booster for [employer] series) Patient will call back with the date.  07/25/2020   INFLUENZA VACCINE Patient will schedule an appointment to come back.  Please consider getting your flu shot in the Fall. If you get this outside of our office, please let us know.  03/03/2021   Please stop by lab before you go If you have mychart- we will send your results within 3 business days of [place] receiving them.  If you do not have mychart- we will call you about results within 5 business days of [place] receiving them.  *please also note that you will see labs on mychart as soon as they post. I will later go in and write notes on them- will say "notes from Dr. Yong Channel"  We will call you within two weeks about your referral to Vascular Surgery. If you do not hear within 2 weeks, give [place] a call.   Recommended follow up: Return in about 6 months (around 09/13/2021) for follow up- or sooner if needed.

## 2021-03-13 NOTE — Progress Notes (Signed)
Phone [phone] In person visit   Subjective:   Amber Jacobs is a 65 y.o. year old very pleasant female patient who presents for/with See problem oriented charting Chief Complaint  Patient presents with   Hyperlipidemia   Hyperglycemia   This visit occurred during the SARS-CoV-2 public health emergency.  Safety protocols were in place, including screening questions prior to the visit, additional usage of staff PPE, and extensive cleaning of exam room while observing appropriate contact time as indicated for disinfecting solutions.   Past Medical History-  Patient Active Problem List   Diagnosis Date Noted   Alopecia areata 10/27/2018    Priority: Medium   Asthma 10/27/2018    Priority: Medium   Genital herpes 05/07/2016    Priority: Medium   CKD (chronic kidney disease), stage III ([place]) 08/07/2014    Priority: Medium   Hyperglycemia 08/07/2014    Priority: Medium   Hyperlipidemia 04/02/2008    Priority: Medium   Tendon rupture, nontraumatic, extensor 03/21/2015    Priority: Low   Hormone replacement therapy 11/01/2014    Priority: Low   Stress at work 08/07/2014    Priority: Low   Injury of toenail 06/13/2014    Priority: Low   Hematuria 05/02/2010    Priority: Low   Strain of left pectoralis muscle 09/20/2015   Chest pain 09/20/2015    Medications- reviewed and updated Current Outpatient Medications  Medication Sig Dispense Refill   COLLAGEN PO Take by mouth.     Multiple Vitamin (MULTIVITAMIN) tablet Take 1 tablet by mouth daily.     Omega-3 1000 MG CAPS Take 2,000 mg by mouth every morning.      sertraline (ZOLOFT) 50 MG tablet TAKE 1 TABLET DAILY 90 tablet 3   simvastatin (ZOCOR) 40 MG tablet TAKE 1 TABLET DAILY OR AS DIRECTED 90 tablet 3   No current facility-administered medications for this visit.     Objective:  BP 129/82   Pulse 64   Temp 98.2 F (36.8 C) (Temporal)   Ht '5\' 7"'$  (1.702 m)   Wt 192 lb 12.8 oz (87.5 kg)   SpO2 97%   BMI  30.20 kg/m  Gen: NAD, resting comfortably CV: RRR no murmurs rubs or gallops Lungs: CTAB no crackles, wheeze, rhonchi.  Ext: trace edema Skin: warm, dry right midfoot medial portion has a 1.5-2 cm by 1.5-2 cm bulge under skin- when compress distally diminishes, when compress proximally enlarges suggesting vascular lesion    Assessment and Plan    #Subcutaneous lesion/likely varicose vein- patient noted lesion on her right foot in April or May of this year-the area tends to bulge out and appears to be vascular.  She is consistent with compression stockings on a regular basis but area seems to have continued to grow slightly.  On exam it certainly appears to be vascular-no pain noted in the area even with increased size-since enlarging despite compression stockings we opted to get vascular surgeries opinion-referral was placed today - no calf pain or tenderness- doubt more proximal DVT - does get some right shin or sometimes into right hamstring pain, occasional right lower leg/calf. Intermittent up to 5/10 pain perhaps 3-5x a month down from daily previously back in December. A1c, tsh and b12 reassuring at that time. Discussed low back films again- no back pain recently- she opts out for now but can call if changes her mind  #hyperlipidemia-peak LDL 213 S: Medication: simvastatin 40 mg daily  Lab Results  Component Value Date  CHOL 163 07/11/2020   HDL 44 (L) 07/11/2020   LDLCALC 100 (H) 07/11/2020   LDLDIRECT 116.0 05/09/2019   TRIG 99 07/11/2020   CHOLHDL 3.7 07/11/2020   A/P: Mild poor control last check-prefer LDL under 70 per new guidelines for ideal control-on the other hand for primary prevention she has at least a 50% reduction in LDL which is excellent-on the other hand has prediabetes risk so we have wanted to hold off on increasing strength of medication-thankfully last A1c was improved   # Hyperglycemia/insulin resistance/prediabetes S:  Medication: none Exercise and diet-  congratulated patient on losing 6 pounds from last visit- she states has not made any specific changes other than cutting down sugar Lab Results  Component Value Date   HGBA1C 5.6 07/11/2020   HGBA1C 6.0 05/09/2019   HGBA1C 5.7 06/10/2017   A/P: hopefully stable a1c or improved- update with labs  #Chronic kidney disease stage III S: GFR is typically in the 50s range -Patient knows to avoid NSAIDs  A/P: Hopefully stable-update CMP with labs today  # Depression S: Medication:Zoloft 50 mg daily  Depression screen [facility] 2/9 03/13/2021 08/29/2020 07/10/2020  Decreased Interest 0 0 0  Down, Depressed, Hopeless 0 0 0  PHQ - 2 Score 0 0 0  Altered sleeping 0 - -  Tired, decreased energy 0 - -  Change in appetite 0 - -  Feeling bad or failure about yourself  0 - -  Trouble concentrating 0 - -  Moving slowly or fidgety/restless 0 - -  Suicidal thoughts 0 - -  PHQ-9 Score 0 - -  Difficult doing work/chores Not difficult at all - -  A/P: full remission- continue current meds  #Asthma is well controlled with prn albuterol - declines refill for now  Recommended follow up: No follow-ups on file. No future appointments.   Lab/Order associations:   ICD-10-CM   1. Hyperlipidemia, unspecified hyperlipidemia type  E78.5     2. Hyperglycemia  R73.9     3. Stage 3a chronic kidney disease (HCC)  N18.31     4. Depression, major, single episode, complete remission ([place])  F32.5      No orders of the defined types were placed in this encounter.  I,Harris Phan,acting as a Education administrator for Garret Reddish, MD.,have documented all relevant documentation on the behalf of Garret Reddish, MD,as directed by  Garret Reddish, MD while in the presence of Garret Reddish, MD.  I, Garret Reddish, MD, have reviewed all documentation for this visit. The documentation on 03/13/21 for the exam, diagnosis, procedures, and orders are all accurate and complete.  Return precautions advised.  Garret Reddish, MD

## 2021-03-14 ENCOUNTER — Encounter: Payer: Self-pay | Admitting: Family Medicine

## 2021-04-11 ENCOUNTER — Other Ambulatory Visit: Payer: Self-pay

## 2021-04-11 DIAGNOSIS — I8390 Asymptomatic varicose veins of unspecified lower extremity: Secondary | ICD-10-CM

## 2021-04-18 ENCOUNTER — Other Ambulatory Visit: Payer: Self-pay

## 2021-04-18 ENCOUNTER — Ambulatory Visit (HOSPITAL_COMMUNITY)
Admission: RE | Admit: 2021-04-18 | Discharge: 2021-04-18 | Disposition: A | Discharge status: Home / Self Care | Payer: Medicare Other | Source: Ambulatory Visit | Attending: Vascular Surgery | Admitting: Vascular Surgery

## 2021-04-18 ENCOUNTER — Ambulatory Visit (INDEPENDENT_AMBULATORY_CARE_PROVIDER_SITE_OTHER): Payer: Medicare Other | Admitting: Physician Assistant

## 2021-04-18 VITALS — BP 116/64 | HR 72 | Temp 97.6°F | Resp 14 | Ht 67.0 in | Wt 191.0 lb

## 2021-04-18 DIAGNOSIS — I872 Venous insufficiency (chronic) (peripheral): Secondary | ICD-10-CM

## 2021-04-18 DIAGNOSIS — I8391 Asymptomatic varicose veins of right lower extremity: Secondary | ICD-10-CM | POA: Diagnosis not present

## 2021-04-18 DIAGNOSIS — I8390 Asymptomatic varicose veins of unspecified lower extremity: Secondary | ICD-10-CM | POA: Diagnosis present

## 2021-04-21 ENCOUNTER — Encounter: Payer: Self-pay | Admitting: Physician Assistant

## 2021-04-21 NOTE — Progress Notes (Signed)
Office Note     CC:  follow up Requesting Provider:  Marin Olp, MD  HPI: Amber Jacobs is a 65 y.o. (May 05, 1956) female who presents for evaluation of vein and right foot that is cause for concern for the patient.  Patient does not know when the area in question developed however she is concerned for spontaneous rupture.  The area is not firm or cystlike in nature.  It does not cause her any pain.  She has not had any inflammation to the area or any tissue changes.  She denies any claudication, rest pain, or nonhealing wounds.  She also has never had a DVT, venous ulceration, trauma to that leg, or any prior vascular intervention.  She denies tobacco use.  Past Medical History:  Diagnosis Date   Cataract    Chronic kidney disease    Depression    stress   Heart murmur    born with   Hyperlipidemia     Past Surgical History:  Procedure Laterality Date   COLONOSCOPY     COSMETIC SURGERY     facial   fibroid tumors     KNEE ARTHROSCOPY     TONSILLECTOMY AND ADENOIDECTOMY     as a child   WISDOM TOOTH EXTRACTION      Social History   Socioeconomic History   Marital status: Married    Spouse name: Not on file   Number of children: Not on file   Years of education: Not on file   Highest education level: Not on file  Occupational History   Not on file  Tobacco Use   Smoking status: Never   Smokeless tobacco: Never  Substance and Sexual Activity   Alcohol use: Yes    Alcohol/week: 2.0 standard drinks    Types: 2 Glasses of wine per week   Drug use: Yes    Comment: occasional marijuana perhaps once a month-goes to [place]   Sexual activity: Not on file  Other Topics Concern   Not on file  Social History Narrative   Married 2006 (together since 1984). No children.  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        Hobbies: gardener, gym, excellent chef   Social Determinants of Health   Financial Resource Strain: Low Risk    Difficulty of Paying Living Expenses: Not hard at all  Food Insecurity: No Food Insecurity   Worried About Charity fundraiser in the Last Year: Never true   Arboriculturist in the Last Year: Never true  Transportation Needs: No Transportation Needs   Lack of Transportation (Medical): No   Lack of Transportation (Non-Medical): No  Physical Activity: Sufficiently Active   Days of Exercise per Week: 4 days   Minutes of Exercise per Session: 50 min  Stress: No Stress Concern Present   Feeling of Stress : Not at all  Social Connections: Socially Integrated   Frequency of Communication with Friends and Family: More than three times a week   Frequency of Social Gatherings with Friends and Family: Twice a week   Attends Religious Services: More than 4 times per year   Active Member of [employer] or Organizations: Yes   Attends Music therapist: More than 4 times per year   Marital Status: Married  Intimate Partner Violence: Not At Risk   Fear of Current or Ex-Partner: No   Emotionally Abused: No   Physically Abused: No   Sexually Abused: No    Family History  Adopted: Yes  Problem Relation Age of Onset   Alcohol abuse Mother        Patient was adopted and not much else known   Colon cancer Neg Hx     Current Outpatient Medications  Medication Sig Dispense Refill   COLLAGEN PO Take by mouth.     Multiple Vitamin (MULTIVITAMIN) tablet Take 1 tablet by mouth daily.     Omega-3 1000 MG CAPS Take 2,000 mg by mouth every morning.      sertraline (ZOLOFT) 50 MG tablet TAKE 1 TABLET DAILY 90 tablet 3   simvastatin (ZOCOR) 40 MG tablet TAKE 1 TABLET DAILY OR AS DIRECTED 90 tablet 3   No current facility-administered medications for this visit.    Allergies  Allergen Reactions   Fentanyl Nausea Only   Zocor [Simvastatin]     Muscle aches and pains      REVIEW OF SYSTEMS:   [X]  denotes positive finding, [ ]  denotes negative finding Cardiac  Comments:  Chest pain or chest pressure:    Shortness of breath upon exertion:    Short of breath when lying flat:    Irregular heart rhythm:        Vascular    Pain in calf, thigh, or hip brought on by ambulation:    Pain in feet at night that wakes you up from your sleep:     Blood clot in your veins:    Leg swelling:         Pulmonary    Oxygen at home:    Productive cough:     Wheezing:         Neurologic    Sudden weakness in arms or legs:     Sudden numbness in arms or legs:     Sudden onset of difficulty speaking or slurred speech:    Temporary loss of vision in one eye:     Problems with dizziness:         Gastrointestinal    Blood in stool:     Vomited blood:         Genitourinary    Burning when urinating:     Blood in urine:        Psychiatric    Major depression:         Hematologic    Bleeding problems:    Problems with blood clotting too easily:        Skin    Rashes or ulcers:        Constitutional    Fever or chills:      PHYSICAL EXAMINATION:  Vitals:   04/18/21 1508  BP: 116/64  Pulse: 72  Resp: 14  Temp: 97.6 F (36.4 C)  TempSrc: Temporal  SpO2: 98%  Weight: 191 lb (86.6 kg)  Height: 5\' 7"  (1.702 m)    General:  WDWN in NAD; vital signs documented above Gait: Not observed HENT: WNL, normocephalic Pulmonary: normal non-labored breathing , without Rales, rhonchi,  wheezing Cardiac: regular HR Abdomen: soft, NT, no masses Skin: without rashes Vascular Exam/Pulses:  Right Left  Radial 2+ (normal) 2+ (normal)  DP 2+ (normal) 2+ (normal)  PT 2+ (normal) 2+ (normal)   Extremities: No edema or varicose veins of bilateral lower extremities; there appears to be a bulging  vein/valve in her medial right foot without overlying tissue compromise, inflammation, or other tissue changes Musculoskeletal: no muscle wasting or  atrophy  Neurologic: A&O X 3;  No focal weakness or paresthesias are detected Psychiatric:  The pt has Normal affect.   Non-Invasive Vascular Imaging:   Reflux study negative for DVT Reflux in the distal femoral vein Reflux at the saphenofemoral junction of the GSV only    ASSESSMENT/PLAN:: 65 y.o. female here for evaluation of bulging vein in the right medial foot/ankle  -Area in question appears to be a bulging vein/valve without any other tissue changes that would suggest infection, spontaneous rupture, or precursor to any ulceration -Venous reflux study showed only very mild deep reflux and superficial reflux.  She does not have a DVT. -Patient is not bothered by any edema of the right leg.  In the future if she were to develop edema or any venous symptoms she could wear knee-high compression, elevate her leg above her heart to improve venous return, and avoid prolonged sitting and standing -Reassured the patient that the area in question does not warrant any further work-up at this time and does not appear to be of any concern in terms of vascular surgery. -Patient will follow up on an as needed basis   Dagoberto Ligas, PA-C [facility] [phone]  Clinic MD:   Trula Slade

## 2021-04-28 ENCOUNTER — Encounter: Payer: Self-pay | Admitting: Family Medicine

## 2021-06-09 ENCOUNTER — Other Ambulatory Visit: Payer: Self-pay | Admitting: Family Medicine

## 2021-08-13 ENCOUNTER — Telehealth: Payer: Self-pay | Admitting: Family Medicine

## 2021-08-13 NOTE — Telephone Encounter (Signed)
Copied from [place] [id number]. Topic: Medicare AWV >> Aug 13, 2021 10:14 AM Harris-Coley, Hannah Beat wrote: Reason for CRM: Left message for patient to schedule Annual Wellness Visit.  Please schedule with Nurse Health Advisor Charlott Rakes, RN at [facility].  Please call [phone] ask for [facility]

## 2021-09-01 ENCOUNTER — Ambulatory Visit (INDEPENDENT_AMBULATORY_CARE_PROVIDER_SITE_OTHER): Payer: Medicare Other

## 2021-09-01 ENCOUNTER — Other Ambulatory Visit: Payer: Self-pay

## 2021-09-01 DIAGNOSIS — Z0000 Encounter for general adult medical examination without abnormal findings: Secondary | ICD-10-CM

## 2021-09-01 NOTE — Patient Instructions (Addendum)
Amber Jacobs , Thank you for taking time to come for your Medicare Wellness Visit. I appreciate your ongoing commitment to your health goals. Please review the following plan we discussed and let me know if I can assist you in the future.   Screening recommendations/referrals: Colonoscopy: Done 07/03/16 repeat every 10 years  Mammogram: Done 05/19/21 repeat every year  Bone Density: Done 08/06/20 repeat as directed  Recommended yearly ophthalmology/optometry visit for glaucoma screening and checkup Recommended yearly dental visit for hygiene and checkup  Vaccinations: Influenza vaccine: Done 04/28/21 repeat every year  Pneumococcal vaccine: Done 07/10/20 Tdap vaccine: Done 08/07/14 repeat every 10 years  Shingles vaccine: Completed 9/9, 06/17/19   Covid-19:Completed 3/19, 4/13, 03/25/20 & 4/14, 04/11/21  Advanced directives: Advance directive discussed with you today. I have provided a copy for you to complete at home and have notarized. Once this is complete please bring a copy in to our office so we can scan it into your chart.   Conditions/risks identified: Lose 5 lbs  Next appointment: Follow up in one year for your annual wellness visit    Preventive Care 50 Years and Older, Female Preventive care refers to lifestyle choices and visits with your health care provider that can promote health and wellness. What does preventive care include? A yearly physical exam. This is also called an annual well check. Dental exams once or twice a year. Routine eye exams. Ask your health care provider how often you should have your eyes checked. Personal lifestyle choices, including: Daily care of your teeth and gums. Regular physical activity. Eating a healthy diet. Avoiding tobacco and drug use. Limiting alcohol use. Practicing safe sex. Taking low-dose aspirin every day. Taking vitamin and mineral supplements as recommended by your health care provider. What happens during an annual well  check? The services and screenings done by your health care provider during your annual well check will depend on your age, overall health, lifestyle risk factors, and family history of disease. Counseling  Your health care provider may ask you questions about your: Alcohol use. Tobacco use. Drug use. Emotional well-being. Home and relationship well-being. Sexual activity. Eating habits. History of falls. Memory and ability to understand (cognition). Work and work Statistician. Reproductive health. Screening  You may have the following tests or measurements: Height, weight, and BMI. Blood pressure. Lipid and cholesterol levels. These may be checked every 5 years, or more frequently if you are over 79 years old. Skin check. Lung cancer screening. You may have this screening every year starting at age 56 if you have a 30-pack-year history of smoking and currently smoke or have quit within the past 15 years. Fecal occult blood test (FOBT) of the stool. You may have this test every year starting at age 52. Flexible sigmoidoscopy or colonoscopy. You may have a sigmoidoscopy every 5 years or a colonoscopy every 10 years starting at age 66. Hepatitis C blood test. Hepatitis B blood test. Sexually transmitted disease (STD) testing. Diabetes screening. This is done by checking your blood sugar (glucose) after you have not eaten for a while (fasting). You may have this done every 1-3 years. Bone density scan. This is done to screen for osteoporosis. You may have this done starting at age 71. Mammogram. This may be done every 1-2 years. Talk to your health care provider about how often you should have regular mammograms. Talk with your health care provider about your test results, treatment options, and if necessary, the need for more tests. Vaccines  Your health care provider may recommend certain vaccines, such as: Influenza vaccine. This is recommended every year. Tetanus, diphtheria, and  acellular pertussis (Tdap, Td) vaccine. You may need a Td booster every 10 years. Zoster vaccine. You may need this after age 61. Pneumococcal 13-valent conjugate (PCV13) vaccine. One dose is recommended after age 42. Pneumococcal polysaccharide (PPSV23) vaccine. One dose is recommended after age 41. Talk to your health care provider about which screenings and vaccines you need and how often you need them. This information is not intended to replace advice given to you by your health care provider. Make sure you discuss any questions you have with your health care provider. Document Released: 08/16/2015 Document Revised: 04/08/2016 Document Reviewed: 05/21/2015 Elsevier Interactive Patient Education  2017 [place] Prevention in the Home Falls can cause injuries. They can happen to people of all ages. There are many things you can do to make your home safe and to help prevent falls. What can I do on the outside of my home? Regularly fix the edges of walkways and driveways and fix any cracks. Remove anything that might make you trip as you walk through a door, such as a raised step or threshold. Trim any bushes or trees on the path to your home. Use bright outdoor lighting. Clear any walking paths of anything that might make someone trip, such as rocks or tools. Regularly check to see if handrails are loose or broken. Make sure that both sides of any steps have handrails. Any raised decks and porches should have guardrails on the edges. Have any leaves, snow, or ice cleared regularly. Use sand or salt on walking paths during winter. Clean up any spills in your garage right away. This includes oil or grease spills. What can I do in the bathroom? Use night lights. Install grab bars by the toilet and in the tub and shower. Do not use towel bars as grab bars. Use non-skid mats or decals in the tub or shower. If you need to sit down in the shower, use a plastic, non-slip stool. Keep  the floor dry. Clean up any water that spills on the floor as soon as it happens. Remove soap buildup in the tub or shower regularly. Attach bath mats securely with double-sided non-slip rug tape. Do not have throw rugs and other things on the floor that can make you trip. What can I do in the bedroom? Use night lights. Make sure that you have a light by your bed that is easy to reach. Do not use any sheets or blankets that are too big for your bed. They should not hang down onto the floor. Have a firm chair that has side arms. You can use this for support while you get dressed. Do not have throw rugs and other things on the floor that can make you trip. What can I do in the kitchen? Clean up any spills right away. Avoid walking on wet floors. Keep items that you use a lot in easy-to-reach places. If you need to reach something above you, use a strong step stool that has a grab bar. Keep electrical cords out of the way. Do not use floor polish or wax that makes floors slippery. If you must use wax, use non-skid floor wax. Do not have throw rugs and other things on the floor that can make you trip. What can I do with my stairs? Do not leave any items on the stairs. Make sure that there are  handrails on both sides of the stairs and use them. Fix handrails that are broken or loose. Make sure that handrails are as long as the stairways. Check any carpeting to make sure that it is firmly attached to the stairs. Fix any carpet that is loose or worn. Avoid having throw rugs at the top or bottom of the stairs. If you do have throw rugs, attach them to the floor with carpet tape. Make sure that you have a light switch at the top of the stairs and the bottom of the stairs. If you do not have them, ask someone to add them for you. What else can I do to help prevent falls? Wear shoes that: Do not have high heels. Have rubber bottoms. Are comfortable and fit you well. Are closed at the toe. Do not  wear sandals. If you use a stepladder: Make sure that it is fully opened. Do not climb a closed stepladder. Make sure that both sides of the stepladder are locked into place. Ask someone to hold it for you, if possible. Clearly mark and make sure that you can see: Any grab bars or handrails. First and last steps. Where the edge of each step is. Use tools that help you move around (mobility aids) if they are needed. These include: Canes. Walkers. Scooters. Crutches. Turn on the lights when you go into a dark area. Replace any light bulbs as soon as they burn out. Set up your furniture so you have a clear path. Avoid moving your furniture around. If any of your floors are uneven, fix them. If there are any pets around you, be aware of where they are. Review your medicines with your doctor. Some medicines can make you feel dizzy. This can increase your chance of falling. Ask your doctor what other things that you can do to help prevent falls. This information is not intended to replace advice given to you by your health care provider. Make sure you discuss any questions you have with your health care provider. Document Released: 05/16/2009 Document Revised: 12/26/2015 Document Reviewed: 08/24/2014 Elsevier Interactive Patient Education  2017 [employer].

## 2021-09-01 NOTE — Progress Notes (Signed)
Virtual Visit via Telephone Note  I connected with  Amber Jacobs on 09/01/21 at  2:30 PM EST by telephone and verified that I am speaking with the correct person using two identifiers.  Medicare Annual Wellness visit completed telephonically due to Covid-19 pandemic.   Persons participating in this call: This Health Coach and this patient.   Location: Patient: Home Provider: Office   I discussed the limitations, risks, security and privacy concerns of performing an evaluation and management service by telephone and the availability of in person appointments. The patient expressed understanding and agreed to proceed.  Unable to perform video visit due to video visit attempted and failed and/or patient does not have video capability.   Some vital signs may be absent or patient reported.   Willette Brace, LPN   Subjective:   Amber Jacobs is a 66 y.o. female who presents for an Initial Medicare Annual Wellness Visit.  Review of Systems     Cardiac Risk Factors include: advanced age (>67men, >16 women)     Objective:    There were no vitals filed for this visit. There is no height or weight on file to calculate BMI.  Advanced Directives 09/01/2021  Does Patient Have a Medical Advance Directive? No  Does patient want to make changes to medical advance directive? Yes (MAU/Ambulatory/Procedural Areas - Information given)    Current Medications (verified) Outpatient Encounter Medications as of 09/01/2021  Medication Sig   COLLAGEN PO Take by mouth.   Multiple Vitamin (MULTIVITAMIN) tablet Take 1 tablet by mouth daily.   Omega-3 1000 MG CAPS Take 2,000 mg by mouth every morning.    sertraline (ZOLOFT) 50 MG tablet TAKE 1 TABLET DAILY   simvastatin (ZOCOR) 40 MG tablet TAKE 1 TABLET DAILY OR AS DIRECTED   No facility-administered encounter medications on file as of 09/01/2021.    Allergies (verified) Fentanyl and Zocor [simvastatin]   History: Past Medical  History:  Diagnosis Date   Cataract    Chronic kidney disease    Depression    stress   Heart murmur    born with   Hyperlipidemia    Past Surgical History:  Procedure Laterality Date   COLONOSCOPY     COSMETIC SURGERY     facial   fibroid tumors     KNEE ARTHROSCOPY     TONSILLECTOMY AND ADENOIDECTOMY     as a child   WISDOM TOOTH EXTRACTION     Family History  Adopted: Yes  Problem Relation Age of Onset   Alcohol abuse Mother        Patient was adopted and not much else known   Colon cancer Neg Hx    Social History   Socioeconomic History   Marital status: Married    Spouse name: Not on file   Number of children: Not on file   Years of education: Not on file   Highest education level: Not on file  Occupational History   Not on file  Tobacco Use   Smoking status: Never   Smokeless tobacco: Never  Substance and Sexual Activity   Alcohol use: Yes    Alcohol/week: 2.0 standard drinks    Types: 2 Glasses of wine per week   Drug use: Yes    Comment: occasional marijuana perhaps once a month-goes to [place]   Sexual activity: Not on file  Other Topics Concern   Not on file  Social History Narrative   Married 2006 (together since 1984). No children.  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 retires       Hobbies: gardener, gym, excellent chef   Social Determinants of Health   Financial Resource Strain: Low Risk    Difficulty of Paying Living Expenses: Not hard at all  Food Insecurity: No Food Insecurity   Worried About Charity fundraiser in the Last Year: Never true   Arboriculturist in the Last Year: Never true  Transportation Needs: No Transportation Needs   Lack of Transportation (Medical): No   Lack of Transportation (Non-Medical): No  Physical Activity: Sufficiently Active   Days of Exercise per Week: 3 days    Minutes of Exercise per Session: 60 min  Stress: No Stress Concern Present   Feeling of Stress : Not at all  Social Connections: Moderately Isolated   Frequency of Communication with Friends and Family: More than three times a week   Frequency of Social Gatherings with Friends and Family: More than three times a week   Attends Religious Services: Never   Marine scientist or Organizations: No   Attends Music therapist: Never   Marital Status: Married    Tobacco Counseling Counseling given: Not Answered   Clinical Intake:  Pre-visit preparation completed: Yes  Pain : No/denies pain     BMI - recorded: 29.91 Nutritional Status: BMI 25 -29 Overweight Nutritional Risks: None Diabetes: No  How often do you need to have someone help you when you read instructions, pamphlets, or other written materials from your doctor or pharmacy?: 1 - Never  Diabetic?no  Interpreter Needed?: No  Information entered by :: Amber Rakes, LPN   Activities of Daily Living In your present state of health, do you have any difficulty performing the following activities: 09/01/2021  Hearing? N  Vision? N  Difficulty concentrating or making decisions? N  Walking or climbing stairs? N  Dressing or bathing? N  Doing errands, shopping? N  Preparing Food and eating ? N  Using the Toilet? N  In the past six months, have you accidently leaked urine? N  Do you have problems with loss of bowel control? N  Managing your Medications? N  Managing your Finances? N  Housekeeping or managing your Housekeeping? N  Some recent data might be hidden    Patient Care Team: Marin Olp, MD as PCP - General (Family Medicine)  Indicate any recent Medical Services you may have received from other than [facility] providers in the past year (date may be approximate).     Assessment:   This is a routine wellness examination for Amber Jacobs.  Hearing/Vision screen Hearing Screening -  Comments:: Denies any hearing issues  Vision Screening - Comments:: Pt follows up with Dr Idolina Primer for annual eye exams   Dietary issues and exercise activities discussed: Current Exercise Habits: Home exercise routine, Type of exercise: Other - see comments, Time (Minutes): 60, Frequency (Times/Week): 3, Weekly Exercise (Minutes/Week): 180   Goals Addressed             This Visit's Progress    Patient Stated       Lose 5 lbs        Depression Screen PHQ 2/9 Scores 09/01/2021 03/13/2021 08/29/2020 07/10/2020 05/09/2019 10/27/2018  PHQ - 2 Score 0 0 0 0 0 0  PHQ- 9 Score - 0 - - - -    Fall Risk Fall Risk  09/01/2021 03/13/2021 08/29/2020  Falls in the past year? 0 0 0  Number falls in past yr: 0 0 0  Injury with Fall? 0 0 0  Risk for fall due to : Impaired vision No Fall Risks -  Follow up Falls prevention discussed Falls evaluation completed -    FALL RISK PREVENTION PERTAINING TO THE HOME:  Any stairs in or around the home? Yes  If so, are there any without handrails? No  Home free of loose throw rugs in walkways, pet beds, electrical cords, etc? No  Adequate lighting in your home to reduce risk of falls? No   ASSISTIVE DEVICES UTILIZED TO PREVENT FALLS:  Life alert? No  Use of a cane, walker or w/c? No  Grab bars in the bathroom? Yes  Shower chair or bench in shower? No  Elevated toilet seat or a handicapped toilet? No   TIMED UP AND GO:  Was the test performed? No .  Cognitive Function:     6CIT Screen 09/01/2021  What Year? 0 points  What month? 0 points  What time? 0 points  Count back from 20 0 points  Months in reverse 0 points  Repeat phrase 0 points  Total Score 0    Immunizations Immunization History  Administered Date(s) Administered   Fluad Quad(high Dose 65+) 04/25/2020   Influenza Split 04/22/2012   Influenza, High Dose Seasonal PF 04/28/2021   Influenza,inj,Quad PF,6+ Mos 04/25/2013, 05/28/2014, 04/05/2015, 05/07/2016, 04/26/2017, 05/18/2018,  05/09/2019   Influenza-Unspecified 06/01/2014   PFIZER(Purple Top)SARS-COV-2 Vaccination 10/20/2019, 11/14/2019, 03/25/2020, 11/14/2020, 04/11/2021   Pneumococcal Polysaccharide-23 07/10/2020   Td 08/04/2003   Tdap 08/07/2014   Zoster Recombinat (Shingrix) 04/12/2019, 06/17/2019   Zoster, Live 06/17/2019    TDAP status: Up to date  Flu Vaccine status: Up to date  Pneumococcal vaccine status: Due, Education has been provided regarding the importance of this vaccine. Advised may receive this vaccine at local pharmacy or Health Dept. Aware to provide a copy of the vaccination record if obtained from local pharmacy or Health Dept. Verbalized acceptance and understanding.  Covid-19 vaccine status: Completed vaccines  Qualifies for Shingles Vaccine? Yes   Zostavax completed Yes   Shingrix Completed?: Yes  Screening Tests Health Maintenance  Topic Date Due   Pneumonia Vaccine 62+ Years old (2 - PCV) 07/10/2021   Hepatitis C Screening  07/31/2098 (Originally 10/22/1973)   MAMMOGRAM  05/20/2023   TETANUS/TDAP  08/07/2024   COLONOSCOPY (Pts 45-68yrs Insurance coverage will need to be confirmed)  07/03/2026   INFLUENZA VACCINE  Completed   DEXA SCAN  Completed   COVID-19 Vaccine  Completed   Zoster Vaccines- Shingrix  Completed   HPV VACCINES  Aged Out    Health Maintenance  Health Maintenance Due  Topic Date Due   Pneumonia Vaccine 67+ Years old (2 - PCV) 07/10/2021    Colorectal cancer screening: Type of screening: Colonoscopy. Completed 07/03/16. Repeat every 10 years  Mammogram status: Completed 05/19/21. Repeat every year  Bone Density status: Completed 08/06/20. Results reflect: Bone density results: NORMAL. Repeat every 5 years.  Additional Screening:  Hepatitis C Screening: does qualify;  Vision Screening: Recommended annual ophthalmology exams for early detection of glaucoma and other disorders of the eye. Is the patient up to date with their annual eye exam?  Yes   Who is the provider or what is the name of the office in which the  patient attends annual eye exams? Dr Idolina Primer If pt is not established with a provider, would they like to be referred to a provider to establish care? No .   Dental Screening: Recommended annual dental exams for proper oral hygiene  Community Resource Referral / Chronic Care Management: CRR required this visit?  No   CCM required this visit?  No      Plan:     I have personally reviewed and noted the following in the patients chart:   Medical and social history Use of alcohol, tobacco or illicit drugs  Current medications and supplements including opioid prescriptions. Patient is not currently taking opioid prescriptions. Functional ability and status Nutritional status Physical activity Advanced directives List of other physicians Hospitalizations, surgeries, and ER visits in previous 12 months Vitals Screenings to include cognitive, depression, and falls Referrals and appointments  In addition, I have reviewed and discussed with patient certain preventive protocols, quality metrics, and best practice recommendations. A written personalized care plan for preventive services as well as general preventive health recommendations were provided to patient.     Willette Brace, LPN   08/13/5518   Nurse Notes: None

## 2021-09-12 NOTE — Progress Notes (Signed)
Phone [phone] In person visit   Subjective:   Amber Jacobs is a 66 y.o. year old very pleasant female patient who presents for/with See problem oriented charting Chief Complaint  Patient presents with   Follow-up   Hyperlipidemia   Hyperglycemia   Depression   Asthma    This visit occurred during the SARS-CoV-2 public health emergency.  Safety protocols were in place, including screening questions prior to the visit, additional usage of staff PPE, and extensive cleaning of exam room while observing appropriate contact time as indicated for disinfecting solutions.   Past Medical History-  Patient Active Problem List   Diagnosis Date Noted   Alopecia areata 10/27/2018    Priority: Medium    Asthma 10/27/2018    Priority: Medium    Genital herpes 05/07/2016    Priority: Medium    CKD (chronic kidney disease), stage III ([place]) 08/07/2014    Priority: Medium    Hyperglycemia 08/07/2014    Priority: Medium    Hyperlipidemia 04/02/2008    Priority: Medium    Tendon rupture, nontraumatic, extensor 03/21/2015    Priority: Low   Hormone replacement therapy 11/01/2014    Priority: Low   Stress at work 08/07/2014    Priority: Low   Injury of toenail 06/13/2014    Priority: Low   Hematuria 05/02/2010    Priority: Low   Strain of left pectoralis muscle 09/20/2015   Chest pain 09/20/2015    Medications- reviewed and updated Current Outpatient Medications  Medication Sig Dispense Refill   COLLAGEN PO Take by mouth.     Multiple Vitamin (MULTIVITAMIN) tablet Take 1 tablet by mouth daily.     Omega-3 1000 MG CAPS Take 2,000 mg by mouth every morning.      sertraline (ZOLOFT) 50 MG tablet TAKE 1 TABLET DAILY 90 tablet 3   simvastatin (ZOCOR) 40 MG tablet TAKE 1 TABLET DAILY OR AS DIRECTED 90 tablet 3   No current facility-administered medications for this visit.     Objective:  BP 104/72    Pulse 66    Temp (!) 97.1 F (36.2 C)    Ht 5\' 7"  (1.702 m)    Wt 190  lb (86.2 kg)    SpO2 97%    BMI 29.76 kg/m  Gen: NAD, resting comfortably CV: RRR no murmurs rubs or gallops Ext: no edema Skin: warm, dry    Assessment and Plan   #social update- has not been able to retire- planning to leave in may still  #hyperlipidemia-peak LDL 213 S: Medication: simvastatin 40 mg daily  Lab Results  Component Value Date   CHOL 163 07/11/2020   HDL 44 (L) 07/11/2020   LDLCALC 100 (H) 07/11/2020   LDLDIRECT 116.0 05/09/2019   TRIG 99 07/11/2020   CHOLHDL 3.7 07/11/2020   A/P: For primary prevention patient has had over 30% reduction in LV also technically successful on last check-update lipid panel with labs today-we have been hesitant about increasing statin to push her LDL goal under 70 for primary prevention due to prediabetes wrist-for now continue current medication   # Hyperglycemia/insulin resistance/prediabetes S:  Medication: none Exercise and diet-her weight is largely stable- travel has made dietary and exercise changes Lab Results  Component Value Date   HGBA1C 6.0 03/13/2021   HGBA1C 5.6 07/11/2020   HGBA1C 6.0 05/09/2019   A/P: update a1c- work has been a continued barrier to making some of the changes she would like to make- hoping this will change  in may   #Chronic kidney disease stage III S: GFR is typically in the 50s  range- slightly better last visit -Patient knows to avoid NSAIDs  A/P: Hopefully stable or improved-we will update CMP with labs today   # Depression S: Medication:Zoloft/sertraline 50 mg daily . Handling work stress and fear of retirement- still doing calm app and a few others that help manage stress Depression screen [facility] 2/9 09/23/2021 09/01/2021 03/13/2021  Decreased Interest 0 0 0  Down, Depressed, Hopeless 0 0 0  PHQ - 2 Score 0 0 0  Altered sleeping 0 - 0  Tired, decreased energy 0 - 0  Change in appetite 0 - 0  Feeling bad or failure about yourself  0 - 0  Trouble concentrating 0 - 0  Moving slowly or  fidgety/restless 0 - 0  Suicidal thoughts 0 - 0  PHQ-9 Score 0 - 0  Difficult doing work/chores Not difficult at all - Not difficult at all  A/P: Remains in full remission-patient wishes to continue current medication   #Asthma is well controlled with prn albuterol in the past- declined refill around the time again today - just had one episode in [facility] where very active indoors- got outside and felt better  #Subcutaneous lesion/likely varicose vein-  -Patient saw vascular on 04/18/2021 and had venous reflux study which only showed mild deep reflux and superficial reflux but no DVT was discovered.  Patient was reassured and told to return on an as-needed basis   #recovering from sciatica- tends to get every 2 years- does not need further workup. Usually triggered by cleaning attic.   #Health maintenance-patient reports had Prevnar 26 called pharmacy and unable to obtain records-she is going to check at home  Health Maintenance Due  Topic Date Due   Pneumonia Vaccine 82+ Years old (2 - PCV) 07/10/2021   Recommended follow up: No follow-ups on file. Future Appointments  Date Time Provider [place]  09/14/2022  3:15 PM [facility] CO[facility]    Lab/Order associations: FASTING   ICD-10-CM   1. Hyperlipidemia, unspecified hyperlipidemia type  E78.5 CBC with Differential/Platelet    Comprehensive metabolic panel    Lipid panel    2. Hyperglycemia  R73.9 Hemoglobin A1c    3. Stage 3a chronic kidney disease (HCC)  N18.31 CBC with Differential/Platelet    Comprehensive metabolic panel    4. Depression, major, single episode, complete remission ([place])  F32.5      No orders of the defined types were placed in this encounter.   I,Jada Bradford,acting as a scribe for Garret Reddish, MD.,have documented all relevant documentation on the behalf of Garret Reddish, MD,as directed by  Garret Reddish, MD while in the presence of Garret Reddish, MD.  I, Garret Reddish,  MD, have reviewed all documentation for this visit. The documentation on 09/23/21 for the exam, diagnosis, procedures, and orders are all accurate and complete.  Return precautions advised.  Garret Reddish, MD

## 2021-09-18 ENCOUNTER — Ambulatory Visit: Payer: Medicare Other | Admitting: Family Medicine

## 2021-09-23 ENCOUNTER — Other Ambulatory Visit: Payer: Self-pay

## 2021-09-23 ENCOUNTER — Encounter: Payer: Self-pay | Admitting: Family Medicine

## 2021-09-23 ENCOUNTER — Ambulatory Visit (INDEPENDENT_AMBULATORY_CARE_PROVIDER_SITE_OTHER): Payer: Medicare Other | Admitting: Family Medicine

## 2021-09-23 VITALS — BP 104/72 | HR 66 | Temp 97.1°F | Ht 67.0 in | Wt 190.0 lb

## 2021-09-23 DIAGNOSIS — R739 Hyperglycemia, unspecified: Secondary | ICD-10-CM | POA: Diagnosis not present

## 2021-09-23 DIAGNOSIS — E785 Hyperlipidemia, unspecified: Secondary | ICD-10-CM | POA: Diagnosis not present

## 2021-09-23 DIAGNOSIS — N1831 Chronic kidney disease, stage 3a: Secondary | ICD-10-CM

## 2021-09-23 DIAGNOSIS — F325 Major depressive disorder, single episode, in full remission: Secondary | ICD-10-CM

## 2021-09-23 LAB — COMPREHENSIVE METABOLIC PANEL
ALT: 24 U/L (ref 0–35)
AST: 21 U/L (ref 0–37)
Albumin: 4.8 g/dL (ref 3.5–5.2)
Alkaline Phosphatase: 62 U/L (ref 39–117)
BUN: 20 mg/dL (ref 6–23)
CO2: 28 mEq/L (ref 19–32)
Calcium: 9.6 mg/dL (ref 8.4–10.5)
Chloride: 104 mEq/L (ref 96–112)
Creatinine, Ser: 1.04 mg/dL (ref 0.40–1.20)
GFR: 55.85 mL/min — ABNORMAL LOW (ref >60.00)
Glucose, Bld: 109 mg/dL — ABNORMAL HIGH (ref 70–99)
Potassium: 4.3 mEq/L (ref 3.5–5.1)
Sodium: 139 mEq/L (ref 135–145)
Total Bilirubin: 0.5 mg/dL (ref 0.2–1.2)
Total Protein: 7 g/dL (ref 6.0–8.3)

## 2021-09-23 LAB — CBC WITH DIFFERENTIAL/PLATELET
Basophils Absolute: 0 10*3/uL (ref 0.0–0.1)
Basophils Relative: 0.5 % (ref 0.0–3.0)
Eosinophils Absolute: 0.1 10*3/uL (ref 0.0–0.7)
Eosinophils Relative: 1.4 % (ref 0.0–5.0)
HCT: 41 % (ref 36.0–46.0)
Hemoglobin: 14 g/dL (ref 12.0–15.0)
Lymphocytes Relative: 27.7 % (ref 12.0–46.0)
Lymphs Abs: 1.8 10*3/uL (ref 0.7–4.0)
MCHC: 34.1 g/dL (ref 30.0–36.0)
MCV: 88.9 fl (ref 78.0–100.0)
Monocytes Absolute: 0.3 10*3/uL (ref 0.1–1.0)
Monocytes Relative: 5 % (ref 3.0–12.0)
Neutro Abs: 4.1 10*3/uL (ref 1.4–7.7)
Neutrophils Relative %: 65.4 % (ref 43.0–77.0)
Platelets: 218 10*3/uL (ref 150.0–400.0)
RBC: 4.61 Mil/uL (ref 3.87–5.11)
RDW: 13.1 % (ref 11.5–15.5)
WBC: 6.3 10*3/uL (ref 4.0–10.5)

## 2021-09-23 LAB — LIPID PANEL
Cholesterol: 167 mg/dL (ref 0–200)
HDL: 36.1 mg/dL — ABNORMAL LOW (ref >39.00)
LDL Cholesterol: 95 mg/dL (ref 0–99)
NonHDL: 130.61
Total CHOL/HDL Ratio: 5
Triglycerides: 179 mg/dL — ABNORMAL HIGH (ref 0.0–149.0)
VLDL: 35.8 mg/dL (ref 0.0–40.0)

## 2021-09-23 LAB — HEMOGLOBIN A1C: Hgb A1c MFr Bld: 6 % (ref 4.6–6.5)

## 2021-09-23 NOTE — Patient Instructions (Addendum)
Health Maintenance Due  Topic Date Due   Pneumonia Vaccine 26+ Years old (2 - PCV) 07/10/2021  - please see if you can find the date of this  Please stop by lab before you go If you have mychart- we will send your results within 3 business days of [place] receiving them.  If you do not have mychart- we will call you about results within 5 business days of [place] receiving them.  *please also note that you will see labs on mychart as soon as they post. I will later go in and write notes on them- will say "notes from Dr. Yong Channel"   Recommended follow up: Return in about 6 months (around 03/23/2022) for follow up- or sooner if needed.

## 2021-10-10 ENCOUNTER — Other Ambulatory Visit: Payer: Self-pay

## 2021-10-10 ENCOUNTER — Encounter: Payer: Self-pay | Admitting: Family Medicine

## 2021-10-10 MED ORDER — ACYCLOVIR 200 MG PO CAPS: ORAL_CAPSULE | ORAL | 3 refills | Status: DC

## 2021-10-10 MED ORDER — ACYCLOVIR 5 % EX OINT: 1.0000 "application " | TOPICAL_OINTMENT | CUTANEOUS | 1 refills | Status: DC

## 2021-10-22 ENCOUNTER — Encounter: Payer: Self-pay | Admitting: Family Medicine

## 2021-10-22 ENCOUNTER — Ambulatory Visit (INDEPENDENT_AMBULATORY_CARE_PROVIDER_SITE_OTHER): Payer: Medicare Other | Admitting: Family Medicine

## 2021-10-22 VITALS — BP 106/60 | HR 89 | Temp 97.7°F | Ht 67.0 in | Wt 190.2 lb

## 2021-10-22 DIAGNOSIS — M7918 Myalgia, other site: Secondary | ICD-10-CM

## 2021-10-22 MED ORDER — CYCLOBENZAPRINE HCL 5 MG PO TABS: 5.0000 mg | ORAL_TABLET | Freq: Every day | ORAL | 0 refills | Status: DC

## 2021-10-22 NOTE — Patient Instructions (Addendum)
We will call you within two weeks about your referral to sports medicine. If you do not hear within 2 weeks, give [place] a call.   ? ?You can also call Phone: [phone] and once again- Dr. Tamala Julian or Dr. Glennon Mac ? ?I am not overly confident that muscle relaxant will help but we opted to try short term perhaps 2-3 days to see if it makes a difference ? ?Recommended follow up: Return for as needed for new, worsening, persistent symptoms. ?

## 2021-10-22 NOTE — Progress Notes (Signed)
? ?Phone [phone] ?In person visit ?  ?Subjective:  ? ?Amber Jacobs is a 66 y.o. year old very pleasant female patient who presents for/with See problem oriented charting ?Chief Complaint  ?Patient presents with  ? sciatic nerve pain  ?  That has gotten worse and been lingering to long for 7 weeks now.  ? ? ?This visit occurred during the SARS-CoV-2 public health emergency.  Safety protocols were in place, including screening questions prior to the visit, additional usage of staff PPE, and extensive cleaning of exam room while observing appropriate contact time as indicated for disinfecting solutions.  ? ?Past Medical History-  ?Patient Active Problem List  ? Diagnosis Date Noted  ? Alopecia areata 10/27/2018  ?  Priority: Medium   ? Asthma 10/27/2018  ?  Priority: Medium   ? Genital herpes 05/07/2016  ?  Priority: Medium   ? CKD (chronic kidney disease), stage III ([place]) 08/07/2014  ?  Priority: Medium   ? Hyperglycemia 08/07/2014  ?  Priority: Medium   ? Hyperlipidemia 04/02/2008  ?  Priority: Medium   ? Tendon rupture, nontraumatic, extensor 03/21/2015  ?  Priority: Low  ? Hormone replacement therapy 11/01/2014  ?  Priority: Low  ? Stress at work 08/07/2014  ?  Priority: Low  ? Injury of toenail 06/13/2014  ?  Priority: Low  ? Hematuria 05/02/2010  ?  Priority: Low  ? Strain of left pectoralis muscle 09/20/2015  ? Chest pain 09/20/2015  ? ? ?Medications- reviewed and updated ?Current Outpatient Medications  ?Medication Sig Dispense Refill  ? acyclovir (ZOVIRAX) 200 MG capsule TAKE 1 CAPSULE FIVE TIMES A DAY 25 capsule 3  ? acyclovir ointment (ZOVIRAX) 5 % Apply 1 application. topically every 3 (three) hours. 30 g 1  ? COLLAGEN PO Take by mouth.    ? cyclobenzaprine (FLEXERIL) 5 MG tablet Take 1 tablet (5 mg total) by mouth at bedtime. 15 tablet 0  ? Multiple Vitamin (MULTIVITAMIN) tablet Take 1 tablet by mouth daily.    ? Omega-3 1000 MG CAPS Take 2,000 mg by mouth every morning.     ? sertraline  (ZOLOFT) 50 MG tablet TAKE 1 TABLET DAILY 90 tablet 3  ? simvastatin (ZOCOR) 40 MG tablet TAKE 1 TABLET DAILY OR AS DIRECTED 90 tablet 3  ? ?No current facility-administered medications for this visit.  ? ?  ?Objective:  ?BP 106/60   Pulse 89   Temp 97.7 ?F (36.5 ?C)   Ht '5\' 7"'$  (1.702 m)   Wt 190 lb 3.2 oz (86.3 kg)   SpO2 97%   BMI 29.79 kg/m?  ?Gen: NAD, resting comfortably ?Back - Normal skin, Spine with normal alignment and no deformity.  No tenderness to vertebral process palpation.  Paraspinous muscles are tender but without spasm.   Range of motion is full at neck and lumbar sacral regions. Negative Straight leg raise. Does have pain with palpation over SI joint as well and surrounding tissues but FABER is negative.  ?Neuro- no saddle anesthesia, 5/5 strength lower extremities ? ?  ? ?Assessment and Plan  ? ?# Left low back and Buttocks pain ?S:pt noted pain had been ongoing for 6 weeks at least. At last visit felt like was recovering and declined further workup. Typical trigger has been cleaning attic- usually occurs every 2 years. Not evan able to do her water yoga ? ?No pain walking. Cannot lay down due to intensity of pain. Pain 10/10- describes as "100". Has to pull leg  up and strap to be able to even sleep. Pain remains in the left buttocks- nothing going down the leg at this time.  ? ?Has tried inversion table. Has been to [place] and tried some exercises. Feels like almost needs to get "pulled out" and has literally tried that with neighbor with mild success for brief period.  ? ?Years ago PT did help with traction ? ?Heat and ice minimal help.  ? ?ROS-No saddle anesthesia, bladder incontinence, fecal incontinence, weakness in extremity, numbness or tingling in extremity- does go to buttocks though. History negative for trauma (other than when cleaning attic), history of cancer, fever, chills, unintentional weight loss, recent bacterial infection, recent IV drug use, HIV ?- does have red flag  of pain worse at night or while supine.  ?A/P: 66 year old female with history of sciatica presenting with primarily left low back and buttocks pain now for 6 weeks (sciatica symptoms in past responded well to traction and she gets some benefit from similar with this that she has tried at home but short lived). She is very interested in sports medicine evaluation and potential osteopathic manipulation-referral was placed [place] [facility] today ?-I am not overly confident that muscle relaxant will help but we opted to try short term perhaps 2-3 days to see if it makes a difference.  She may even try this before trying her inversion table ?- Does not want to try prednisone at this time.  Cautious with NSAIDs with GFR under 60 ?-Lab work largely stable at last visit and she was 2 weeks into the pain so we did not want to repeat at this time ? ?Recommended follow up: Return for as needed for new, worsening, persistent symptoms. ?Future Appointments  ?Date Time Provider [place]  ?03/24/2022 10:00 AM Marin Olp, MD [facility]  ?09/14/2022  3:15 PM [facility] CO[facility]  ? ? ?Lab/Order associations: ?  ICD-10-CM   ?1. Left buttock pain  M79.18 Ambulatory referral to Sports Medicine  ?  ? ? ?Meds ordered this encounter  ?Medications  ? cyclobenzaprine (FLEXERIL) 5 MG tablet  ?  Sig: Take 1 tablet (5 mg total) by mouth at bedtime.  ?  Dispense:  15 tablet  ?  Refill:  0  ?  Do not drive for 8 hours after use  ? ?I,Jada Bradford,acting as a scribe for Garret Reddish, MD.,have documented all relevant documentation on the behalf of Garret Reddish, MD,as directed by  Garret Reddish, MD while in the presence of Garret Reddish, MD. ? ? I, Garret Reddish, MD, have reviewed all documentation for this visit. The documentation on 10/22/21 for the exam, diagnosis, procedures, and orders are all accurate and complete.  ? ? Time Spent: ?21 minutes of total time (10:01 AM- 10:22 AM) was spent on the  date of the encounter performing the following actions: chart review prior to seeing the patient, obtaining history, performing a medically necessary exam, counseling on the treatment plan, placing orders, and documenting in our EHR.   ? ?Return precautions advised.  ?Garret Reddish, MD ? ? ?

## 2021-10-24 ENCOUNTER — Ambulatory Visit (INDEPENDENT_AMBULATORY_CARE_PROVIDER_SITE_OTHER): Payer: Medicare Other | Admitting: Sports Medicine

## 2021-10-24 ENCOUNTER — Other Ambulatory Visit: Payer: Self-pay

## 2021-10-24 VITALS — BP 120/80 | HR 74 | Ht 67.0 in | Wt 194.0 lb

## 2021-10-24 DIAGNOSIS — M9904 Segmental and somatic dysfunction of sacral region: Secondary | ICD-10-CM

## 2021-10-24 DIAGNOSIS — M533 Sacrococcygeal disorders, not elsewhere classified: Secondary | ICD-10-CM

## 2021-10-24 DIAGNOSIS — M9905 Segmental and somatic dysfunction of pelvic region: Secondary | ICD-10-CM | POA: Diagnosis not present

## 2021-10-24 DIAGNOSIS — M9903 Segmental and somatic dysfunction of lumbar region: Secondary | ICD-10-CM

## 2021-10-24 NOTE — Progress Notes (Signed)
? ? Amber Jacobs ?[facility] ?Amber Jacobs ?Phone: [phone] ?  ?Assessment and Plan:   ?  ?1. SI (sacroiliac) pain ?2. Somatic dysfunction of lumbar region ?3. Somatic dysfunction of pelvic region ?4. Somatic dysfunction of sacral region ?-Chronic with exacerbation, initial sports medicine visit ?- Likely dysfunction of left SI joint based on HPI, physical exam ?- New red flag signs or MOI, so no imaging obtained today ?- Use Tylenol as needed for pain relief.  GFR 50-60, so patient tries to avoid NSAIDs ?- HEP for sacrum ?- Patient elected to trial initial OMT today.  Tolerated well per note below. ?- Decision today to treat with OMT was based on Physical Exam ? ?After verbal consent patient was treated with HVLA (high velocity low amplitude), ME (muscle energy), FPR (flex positional release), ST (soft tissue), PC/PD (Pelvic Compression/ Pelvic Decompression) techniques in sacrum, lumbar, and pelvic areas. Patient tolerated the procedure well with improvement in symptoms.  Patient educated on potential side effects of soreness and recommended to rest, hydrate, and use Tylenol as needed for pain control.  ?  ?Pertinent previous records reviewed include PCP note 10/22/2021, bone density 08/06/2020, CMP 09/23/2021 ?  ?Follow Up: 2 weeks for repeat OMT if patient tolerated well today.  If no improving or worsening of symptoms, would obtain lumbar x-ray, left hip and pelvis x-ray ?  ?Subjective:   ?I, Amber Jacobs, am serving as a Education administrator for Doctor Amber Jacobs ? ?Chief Complaint: left buttock pain  ? ?HPI:  ? ?10/24/21 ?Patient is a 66 year old female complaining of left buttock pain. Patient states pain had been ongoing for 8 weeks at least. At last visit felt like was recovering and declined further workup. Typical trigger has been cleaning attic- usually occurs every 2 years. Not even able to do her water yoga ?No pain walking. Cannot lay down due  to intensity of pain. Pain 10/10- describes as "100". Has to pull leg up and strap to be able to even sleep. Pain remains in the left buttocks- nothing going down the leg at this time.  ?Has tried inversion table. Has been to [place] and tried some exercises. Feels like almost needs to get "pulled out" and has literally tried that with neighbor with mild success for brief period. Years ago PT did help with traction. Heat and ice minimal help. would like to discuss OMT  ?  ? ?Relevant Historical Information: CKD stage III ? ?Additional pertinent review of systems negative. ? ? ?Current Outpatient Medications:  ?  acyclovir (ZOVIRAX) 200 MG capsule, TAKE 1 CAPSULE FIVE TIMES A DAY, Disp: 25 capsule, Rfl: 3 ?  acyclovir ointment (ZOVIRAX) 5 %, Apply 1 application. topically every 3 (three) hours., Disp: 30 g, Rfl: 1 ?  COLLAGEN PO, Take by mouth., Disp: , Rfl:  ?  cyclobenzaprine (FLEXERIL) 5 MG tablet, Take 1 tablet (5 mg total) by mouth at bedtime., Disp: 15 tablet, Rfl: 0 ?  Multiple Vitamin (MULTIVITAMIN) tablet, Take 1 tablet by mouth daily., Disp: , Rfl:  ?  Omega-3 1000 MG CAPS, Take 2,000 mg by mouth every morning. , Disp: , Rfl:  ?  sertraline (ZOLOFT) 50 MG tablet, TAKE 1 TABLET DAILY, Disp: 90 tablet, Rfl: 3 ?  simvastatin (ZOCOR) 40 MG tablet, TAKE 1 TABLET DAILY OR AS DIRECTED, Disp: 90 tablet, Rfl: 3  ? ?Objective:   ?  ?Vitals:  ? 10/24/21 1105  ?BP: 120/80  ?Pulse: 74  ?  SpO2: 98%  ?Weight: 194 lb (88 kg)  ?Height: '5\' 7"'$  (1.702 m)  ?  ?  ?Body mass index is 30.38 kg/m?.  ?  ?Physical Exam:   ? ?General: Well-appearing, cooperative, sitting comfortably in no acute distress.  ? ?OMT Physical Exam: ? ?ASIS Compression Test: Positive left ?Sacrum: Positive sphinx, TTP left sacral base ?Lumbar: TTP paraspinal, L5 rotated right, L1-3 RLSR ?Pelvis: Left anterior innominate  ? ? ?Electronically signed by:  ?Amber Jacobs ?[facility] ?11:34 AM 10/24/21 ?

## 2021-10-24 NOTE — Patient Instructions (Addendum)
Good to see you  ?Sacral HEP  ?2 week follow up for repeat OMT  ?

## 2021-11-10 ENCOUNTER — Ambulatory Visit: Payer: Medicare Other | Admitting: Sports Medicine

## 2022-03-09 ENCOUNTER — Other Ambulatory Visit: Payer: Self-pay | Admitting: Family Medicine

## 2022-03-12 ENCOUNTER — Encounter: Payer: Self-pay | Admitting: Family Medicine

## 2022-03-24 ENCOUNTER — Encounter: Payer: Self-pay | Admitting: Family Medicine

## 2022-03-24 ENCOUNTER — Ambulatory Visit (INDEPENDENT_AMBULATORY_CARE_PROVIDER_SITE_OTHER): Payer: Medicare Other | Admitting: Family Medicine

## 2022-03-24 VITALS — BP 100/60 | HR 72 | Temp 98.4°F | Ht 67.0 in | Wt 188.8 lb

## 2022-03-24 DIAGNOSIS — N1831 Chronic kidney disease, stage 3a: Secondary | ICD-10-CM

## 2022-03-24 DIAGNOSIS — E785 Hyperlipidemia, unspecified: Secondary | ICD-10-CM

## 2022-03-24 DIAGNOSIS — R739 Hyperglycemia, unspecified: Secondary | ICD-10-CM

## 2022-03-24 LAB — COMPREHENSIVE METABOLIC PANEL
ALT: 20 U/L (ref 0–35)
AST: 22 U/L (ref 0–37)
Albumin: 4.6 g/dL (ref 3.5–5.2)
Alkaline Phosphatase: 60 U/L (ref 39–117)
BUN: 23 mg/dL (ref 6–23)
CO2: 26 mEq/L (ref 19–32)
Calcium: 9.4 mg/dL (ref 8.4–10.5)
Chloride: 104 mEq/L (ref 96–112)
Creatinine, Ser: 0.95 mg/dL (ref 0.40–1.20)
GFR: 62.04 mL/min (ref >60.00)
Glucose, Bld: 104 mg/dL — ABNORMAL HIGH (ref 70–99)
Potassium: 4.3 mEq/L (ref 3.5–5.1)
Sodium: 139 mEq/L (ref 135–145)
Total Bilirubin: 0.6 mg/dL (ref 0.2–1.2)
Total Protein: 7.1 g/dL (ref 6.0–8.3)

## 2022-03-24 LAB — HEMOGLOBIN A1C: Hgb A1c MFr Bld: 6 % (ref 4.6–6.5)

## 2022-03-24 MED ORDER — SERTRALINE HCL 50 MG PO TABS: 50.0000 mg | ORAL_TABLET | Freq: Every day | ORAL | 3 refills | Status: DC

## 2022-03-24 NOTE — Patient Instructions (Addendum)
Flu shot- we should have these available within a month or two but please let us know if you get at outside pharmacy  Please stop by lab before you go If you have mychart- we will send your results within 3 business days of [place] receiving them.  If you do not have mychart- we will call you about results within 5 business days of [place] receiving them.  *please also note that you will see labs on mychart as soon as they post. I will later go in and write notes on them- will say "notes from Dr. Yong Channel"   Recommended follow up: Return in about 6 months (around 09/24/2022) for followup or sooner if needed.Schedule b4 you leave.

## 2022-03-24 NOTE — Progress Notes (Signed)
Phone [phone] In person visit   Subjective:   Amber Jacobs is a 66 y.o. year old very pleasant female patient who presents for/with See problem oriented charting Chief Complaint  Patient presents with   Follow-up   Hyperlipidemia    Past Medical History-  Patient Active Problem List   Diagnosis Date Noted   Alopecia areata 10/27/2018    Priority: Medium    Asthma 10/27/2018    Priority: Medium    Genital herpes 05/07/2016    Priority: Medium    CKD (chronic kidney disease), stage III ([place]) 08/07/2014    Priority: Medium    Hyperglycemia 08/07/2014    Priority: Medium    Hyperlipidemia 04/02/2008    Priority: Medium    Tendon rupture, nontraumatic, extensor 03/21/2015    Priority: Low   Hormone replacement therapy 11/01/2014    Priority: Low   Stress at work 08/07/2014    Priority: Low   Injury of toenail 06/13/2014    Priority: Low   Hematuria 05/02/2010    Priority: Low   Strain of left pectoralis muscle 09/20/2015   Chest pain 09/20/2015    Medications- reviewed and updated Current Outpatient Medications  Medication Sig Dispense Refill   acyclovir (ZOVIRAX) 200 MG capsule TAKE 1 CAPSULE FIVE TIMES A DAY 25 capsule 3   acyclovir ointment (ZOVIRAX) 5 % Apply 1 application. topically every 3 (three) hours. 30 g 1   COLLAGEN PO Take by mouth.     Multiple Vitamin (MULTIVITAMIN) tablet Take 1 tablet by mouth daily.     Omega-3 1000 MG CAPS Take 2,000 mg by mouth every morning.      simvastatin (ZOCOR) 40 MG tablet TAKE 1 TABLET DAILY OR AS DIRECTED 90 tablet 3   sertraline (ZOLOFT) 50 MG tablet Take 1 tablet (50 mg total) by mouth daily. 90 tablet 3   No current facility-administered medications for this visit.     Objective:  BP 100/60   Pulse 72   Temp 98.4 F (36.9 C)   Ht '5\' 7"'$  (1.702 m)   Wt 188 lb 12.8 oz (85.6 kg)   SpO2 97%   BMI 29.57 kg/m  Gen: NAD, resting comfortably CV: RRR no murmurs rubs or gallops Lungs: CTAB no crackles,  wheeze, rhonchi Ext: no edema Skin: warm, dry     Assessment and Plan   #Social update-retired 5 weeks ago- looking at what her structure will be like- processing at present. Still working at [employer].   #hyperlipidemia #CKD stage III-most recent GFR 56.  Knows to avoid NSAIDs S: Medication:simvastatin 40 mg  Lab Results  Component Value Date   CHOL 167 09/23/2021   HDL 36.10 (L) 09/23/2021   LDLCALC 95 09/23/2021   LDLDIRECT 116.0 05/09/2019   TRIG 179.0 (H) 09/23/2021   CHOLHDL 5 09/23/2021   A/P: for cholesterol- Reasonable control for prevention-continue current medication with over 30% reduction from peak LDL-also cautious with increasing statin due to prediabetes risk  CKD III- presume stable- update cmp and avoid nsaids.   # Hyperglycemia/insulin resistance/prediabetes S:  Medication: None Exercise and diet- Down 2 pounds from February visit. She is concerned could be higher with celebrations from retirement- including cheesecake!  -moving more as not sitting at work desk for 10 hours a day. Doing [employer] still 3 days a week Lab Results  Component Value Date   HGBA1C 6.0 09/23/2021   HGBA1C 6.0 03/13/2021   HGBA1C 5.6 07/11/2020   A/P:  hopefully stable or improved- update  A1c today. Continue current meds for now    #Situational stress particularly work-remains on sertraline 50 mg and reports reasonable control- she wants to maintain this.  Also using calm app or others as well.  no SI  #sciatica/left SI joint issues- did well with OMT with Dr. Glennon Mac- doing exercises - not in the attic until the fall but would go back quickly to him if issues  Recommended follow up: Return in about 6 months (around 09/24/2022) for followup or sooner if needed.Schedule b4 you leave. Future Appointments  Date Time Provider [place]  09/14/2022  3:15 PM [facility] CO[facility]   Lab/Order associations:   ICD-10-CM   1. Hyperlipidemia, unspecified  hyperlipidemia type  E78.5     2. Hyperglycemia  R73.9 HgB A1c    3. Stage 3a chronic kidney disease (HCC)  N18.31 Comprehensive metabolic panel     Meds ordered this encounter  Medications   sertraline (ZOLOFT) 50 MG tablet    Sig: Take 1 tablet (50 mg total) by mouth daily.    Dispense:  90 tablet    Refill:  3    Return precautions advised.  Garret Reddish, MD

## 2022-03-28 ENCOUNTER — Encounter: Payer: Self-pay | Admitting: Family Medicine

## 2022-07-05 ENCOUNTER — Other Ambulatory Visit: Payer: Self-pay | Admitting: Family Medicine

## 2022-07-13 DIAGNOSIS — H35372 Puckering of macula, left eye: Secondary | ICD-10-CM | POA: Diagnosis not present

## 2022-07-23 DIAGNOSIS — Z7689 Persons encountering health services in other specified circumstances: Secondary | ICD-10-CM | POA: Diagnosis not present

## 2022-07-23 DIAGNOSIS — Z1231 Encounter for screening mammogram for malignant neoplasm of breast: Secondary | ICD-10-CM | POA: Diagnosis not present

## 2022-07-23 LAB — HM MAMMOGRAPHY: HM Mammogram: NORMAL (ref 0–4)

## 2022-07-31 NOTE — Progress Notes (Unsigned)
   Amber Jacobs D.[place] Phone: [phone]   Assessment and Plan:     There are no diagnoses linked to this encounter.  *** - Patient has received significant relief with OMT in the past.  Elects for repeat OMT today.  Tolerated well per note below. - Decision today to treat with OMT was based on Physical Exam   After verbal consent patient was treated with HVLA (high velocity low amplitude), ME (muscle energy), FPR (flex positional release), ST (soft tissue), PC/PD (Pelvic Compression/ Pelvic Decompression) techniques in cervical, rib, thoracic, lumbar, and pelvic areas. Patient tolerated the procedure well with improvement in symptoms.  Patient educated on potential side effects of soreness and recommended to rest, hydrate, and use Tylenol as needed for pain control.   Pertinent previous records reviewed include ***   Follow Up: ***     Subjective:   I, Amber Jacobs, am serving as a Education administrator for Doctor Glennon Mac   Chief Complaint: left buttock pain    HPI:    10/24/21 Patient is a 66 year old female complaining of left buttock pain. Patient states pain had been ongoing for 8 weeks at least. At last visit felt like was recovering and declined further workup. Typical trigger has been cleaning attic- usually occurs every 2 years. Not even able to do her water yoga No pain walking. Cannot lay down due to intensity of pain. Pain 10/10- describes as "100". Has to pull leg up and strap to be able to even sleep. Pain remains in the left buttocks- nothing going down the leg at this time.  Has tried inversion table. Has been to [place] and tried some exercises. Feels like almost needs to get "pulled out" and has literally tried that with neighbor with mild success for brief period. Years ago PT did help with traction. Heat and ice minimal help. would like to discuss OMT    08/04/2022 Patient states   Relevant  Historical Information: CKD stage III  Additional pertinent review of systems negative.  Current Outpatient Medications  Medication Sig Dispense Refill   acyclovir (ZOVIRAX) 200 MG capsule TAKE 1 CAPSULE BY MOUTH FIVE TIMES DAILY 25 capsule 3   acyclovir ointment (ZOVIRAX) 5 % Apply 1 application. topically every 3 (three) hours. 30 g 1   COLLAGEN PO Take by mouth.     Multiple Vitamin (MULTIVITAMIN) tablet Take 1 tablet by mouth daily.     Omega-3 1000 MG CAPS Take 2,000 mg by mouth every morning.      sertraline (ZOLOFT) 50 MG tablet Take 1 tablet (50 mg total) by mouth daily. 90 tablet 3   simvastatin (ZOCOR) 40 MG tablet TAKE 1 TABLET DAILY OR AS DIRECTED 90 tablet 3   No current facility-administered medications for this visit.      Objective:     There were no vitals filed for this visit.    There is no height or weight on file to calculate BMI.    Physical Exam:     General: Well-appearing, cooperative, sitting comfortably in no acute distress.   OMT Physical Exam:  ASIS Compression Test: Positive Right Cervical: TTP paraspinal, *** Rib: Bilateral elevated first rib with TTP Thoracic: TTP paraspinal,*** Lumbar: TTP paraspinal,*** Pelvis: Right anterior innominate  Electronically signed by:  Amber Jacobs D.[facility] 8:19 AM 07/31/22

## 2022-08-04 ENCOUNTER — Ambulatory Visit (INDEPENDENT_AMBULATORY_CARE_PROVIDER_SITE_OTHER): Payer: Medicare Other | Admitting: Sports Medicine

## 2022-08-04 VITALS — BP 120/84 | HR 77 | Ht 67.0 in | Wt 196.0 lb

## 2022-08-04 DIAGNOSIS — M9903 Segmental and somatic dysfunction of lumbar region: Secondary | ICD-10-CM | POA: Diagnosis not present

## 2022-08-04 DIAGNOSIS — G8929 Other chronic pain: Secondary | ICD-10-CM

## 2022-08-04 DIAGNOSIS — M545 Low back pain, unspecified: Secondary | ICD-10-CM

## 2022-08-04 DIAGNOSIS — M9905 Segmental and somatic dysfunction of pelvic region: Secondary | ICD-10-CM

## 2022-08-04 DIAGNOSIS — M9904 Segmental and somatic dysfunction of sacral region: Secondary | ICD-10-CM | POA: Diagnosis not present

## 2022-08-04 NOTE — Patient Instructions (Signed)
Good to see you   

## 2022-08-05 DIAGNOSIS — Z23 Encounter for immunization: Secondary | ICD-10-CM | POA: Diagnosis not present

## 2022-08-06 ENCOUNTER — Encounter: Payer: Self-pay | Admitting: Family Medicine

## 2022-08-17 ENCOUNTER — Encounter: Payer: Self-pay | Admitting: Family Medicine

## 2022-08-17 ENCOUNTER — Telehealth: Payer: Self-pay | Admitting: Family Medicine

## 2022-08-17 NOTE — Telephone Encounter (Signed)
Noted  

## 2022-08-17 NOTE — Telephone Encounter (Signed)
Spoke with pt. She states she is feeling better and does not need an appt.

## 2022-08-17 NOTE — Telephone Encounter (Signed)
Advised to see provider in 24 hrs. Will call to schedule appt.  Patient Name: Amber Jacobs Gender: Female DOB: Jan 22, 1956 Age: 67 Y 9 M 22 D Return Phone Number: [phone] (Primary), [phone] (Secondary) Address: City/ State/ Zip: [place]  [address] Client [facility] Client Site [facility] [place] [facility] Night Provider Garret Reddish- MD Contact Type Call Who Is Calling Patient / Member / Family / Caregiver Call Type Triage / Clinical Relationship To Patient Self Return Phone Number [phone] (Primary) Chief Complaint Nasal Congestion Reason for Call Symptomatic / Request for Health Information Initial Comment Caller states she has covid. She has nasal congestion and is wondering if she needs medication. Translation No Nurse Assessment Nurse: Merlyn Lot, RN, Lilly Date/Time (Eastern Time): 08/15/2022 9:34:00 AM Confirm and document reason for call. If symptomatic, describe symptoms. ---Caller reports sore throat for the past two days. She states she has developed fatigue and congestion since then she has tested positive for Covid. She is wondering if she needs medication. Does the patient have any new or worsening symptoms? ---Yes Will a triage be completed? ---Yes Related visit to physician within the last 2 weeks? ---No Does the PT have any chronic conditions? (i.e. diabetes, asthma, this includes High risk factors for pregnancy, etc.) ---No Is this a behavioral health or substance abuse call? ---No Guidelines Guideline Title Affirmed Question Affirmed Notes Nurse Date/Time ([place] Time) COVID-19 - Diagnosed or Suspected [1] HIGH RISK patient (e.g., weak immune system, age > 9 years, obesity with BMI 30 or higher, pregnant, chronic lung disease or other chronic [place], RN, Lilly 08/15/2022 9:35:02 AM Guidelines Guideline Title Affirmed Question Affirmed Notes Nurse  Date/Time [place] Ghazi Time) medical condition) AND [2] COVID symptoms (e.g., cough, fever) (Exceptions: Already seen by PCP and no new or worsening symptoms.) Disp. Time [place] Ghazi Time) Disposition Final User 08/15/2022 9:39:10 AM Call PCP within 24 Hours Yes Merlyn Lot, [place], [place] Final Disposition 08/15/2022 9:39:10 AM Call PCP within 24 Hours Yes Merlyn Lot, RN, Nurse, children's Understands Yes PreDisposition Call Doctor Care Advice Given Per Guideline CALL PCP WITHIN 24 HOURS: * You need to discuss this with your doctor (or NP/PA) within the next 24 hours. * IF OFFICE WILL BE CLOSED: I'll page the on-call provider now. EXCEPTION: from 9 pm to 9 am. Since this isn't urgent, we'll hold the page until morning. ALTERNATE DISPOSITION - TELEMEDICINE WITHIN 24 HOURS: * Telemedicine may be your best choice for care during this COVID-19 outbreak. GENERAL CARE ADVICE FOR COVID-19 SYMPTOMS: * The symptoms are generally treated the same whether you have COVID-19, influenza or some other respiratory virus. * Feeling dehydrated: Drink extra liquids. If the air in your home is dry, use a humidifier. * Fever, Chills, and Sweats: For fever over 101 F (38.3 C), take acetaminophen every 4 to 6 hours (Adults 650 mg) OR ibuprofen every 6 to 8 hours (Adults 400 mg). Before taking any medicine, read all the instructions on the package. Do not take aspirin unless your doctor has prescribed it for you. Chills can sometimes come before a fever. You may feel cold in your hands and feet. You may have shivering. You may also feel sweaty as your body temperature goes down. * Muscle aches, headache, and other pains: Often this comes and goes with the fever. Take acetaminophen every 4 to 6 hours (Adults 650 mg) OR ibuprofen every 6 to 8 hours (Adults 400 mg). Before taking any medicine, read  all the instructions on the package. FEVER MEDICINES: * For fevers above 101 F (38.3 C) take either  acetaminophen or ibuprofen. COVID-19 - HOW TO PROTECT OTHERS - WHEN YOU ARE SICK WITH COVID-19: * STAY HOME A MINIMUM OF 5 DAYS: People with MILD COVID-19 can STOP HOME ISOLATION AFTER 5 DAYS if (1) fever has been gone for 24 hours (without using fever medicine) AND (2) symptoms are better. Continue to wear a well-fitted mask for a full 10 days when around others. * WEAR A MASK FOR 10 DAYS: Wear a well-fitted mask for 10 full days any time you are around others inside your home or in public. Do not go to places where you are unable to wear a mask. CALL BACK IF: * You become worse CARE ADVICE given per COVID-19 - DIAGNOSED OR SUSPECTED (Adult) guideline.

## 2022-09-14 ENCOUNTER — Ambulatory Visit (INDEPENDENT_AMBULATORY_CARE_PROVIDER_SITE_OTHER): Payer: Medicare Other

## 2022-09-14 VITALS — Wt 194.0 lb

## 2022-09-14 DIAGNOSIS — Z0000 Encounter for general adult medical examination without abnormal findings: Secondary | ICD-10-CM | POA: Diagnosis not present

## 2022-09-14 NOTE — Progress Notes (Signed)
I connected with  Delano Metz on 09/14/22 by a audio enabled telemedicine application and verified that I am speaking with the correct person using two identifiers.  Patient Location: Home  Provider Location: Office/Clinic  I discussed the limitations of evaluation and management by telemedicine. The patient expressed understanding and agreed to proceed.   Subjective:   Jasslyn Vietti is a 67 y.o. female who presents for Medicare Annual (Subsequent) preventive examination.  Review of Systems     Cardiac Risk Factors include: advanced age (>23mn, >>27women)     Objective:    Today's Vitals   09/14/22 1530  Weight: 194 lb (88 kg)   Body mass index is 30.38 kg/m.     09/14/2022    3:34 PM 09/01/2021    2:28 PM  Advanced Directives  Does Patient Have a Medical Advance Directive? Yes No  Type of AParamedicof AVioletLiving will   Does patient want to make changes to medical advance directive?  Yes (MAU/Ambulatory/Procedural Areas - Information given)  Copy of [place] Chart? No - copy requested     Current Medications (verified) Outpatient Encounter Medications as of 09/14/2022  Medication Sig   acyclovir (ZOVIRAX) 200 MG capsule TAKE 1 CAPSULE BY MOUTH FIVE TIMES DAILY   acyclovir ointment (ZOVIRAX) 5 % Apply 1 application. topically every 3 (three) hours.   COLLAGEN PO Take by mouth.   Multiple Vitamin (MULTIVITAMIN) tablet Take 1 tablet by mouth daily.   Omega-3 1000 MG CAPS Take 2,000 mg by mouth every morning.    sertraline (ZOLOFT) 50 MG tablet Take 1 tablet (50 mg total) by mouth daily.   simvastatin (ZOCOR) 40 MG tablet TAKE 1 TABLET DAILY OR AS DIRECTED   No facility-administered encounter medications on file as of 09/14/2022.    Allergies (verified) Fentanyl and Zocor [simvastatin]   History: Past Medical History:  Diagnosis Date   Cataract    Chronic kidney disease    Depression    stress    Heart murmur    born with   Hyperlipidemia    Past Surgical History:  Procedure Laterality Date   COLONOSCOPY     COSMETIC SURGERY     facial   fibroid tumors     KNEE ARTHROSCOPY     TONSILLECTOMY AND ADENOIDECTOMY     as a child   WISDOM TOOTH EXTRACTION     Family History  Adopted: Yes  Problem Relation Age of Onset   Alcohol abuse Mother        Patient was adopted and not much else known   Colon cancer Neg Hx    Social History   Socioeconomic History   Marital status: Married    Spouse name: Not on file   Number of children: Not on file   Years of education: Not on file   Highest education level: Not on file  Occupational History   Not on file  Tobacco Use   Smoking status: Never   Smokeless tobacco: Never  Substance and Sexual Activity   Alcohol use: Yes    Alcohol/week: 2.0 standard drinks of alcohol    Types: 2 Glasses of wine per week   Drug use: Yes    Comment: occasional marijuana perhaps once a month[place]  Sexual activity: Not on file  Other Topics Concern   Not on file  Social History Narrative   Married 2006 (together since 1984). No children.  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       Hobbies: gardener, gym, excellent chef   Social Determinants of Health   Financial Resource Strain: Low Risk  (09/14/2022)   Overall Financial Resource Strain (CARDIA)    Difficulty of Paying Living Expenses: Not hard at all  Food Insecurity: No Food Insecurity (09/14/2022)   Hunger Vital Sign    Worried About Running Out of Food in the Last Year: Never true    Ran Out of Food in the Last Year: Never true  Transportation Needs: No Transportation Needs (09/14/2022)   PRAPARE - Hydrologist (Medical): No    Lack of Transportation (Non-Medical): No  Physical Activity:  Sufficiently Active (09/14/2022)   Exercise Vital Sign    Days of Exercise per Week: 6 days    Minutes of Exercise per Session: 90 min  Stress: No Stress Concern Present (09/14/2022)   [place]    Feeling of Stress : Not at all  Social Connections: Moderately Integrated (09/14/2022)   Social Connection and Isolation Panel [NHANES]    Frequency of Communication with Friends and Family: More than three times a week    Frequency of Social Gatherings with Friends and Family: More than three times a week    Attends Religious Services: Never    Marine scientist or Organizations: Yes    Attends Music therapist: 1 to 4 times per year    Marital Status: Married    Tobacco Counseling Counseling given: Not Answered   Clinical Intake:  Pre-visit preparation completed: Yes  Pain : No/denies pain     BMI - recorded: 30.38 Nutritional Status: BMI > 30  Obese Nutritional Risks: None Diabetes: No  How often do you need to have someone help you when you read instructions, pamphlets, or other written materials from your doctor or pharmacy?: 1 - Never  Diabetic?no  Interpreter Needed?: No  Information entered by :: Charlott Rakes, LPN   Activities of Daily Living    09/14/2022    3:36 PM  In your present state of health, do you have any difficulty performing the following activities:  Hearing? 0  Vision? 0  Difficulty concentrating or making decisions? 0  Walking or climbing stairs? 0  Dressing or bathing? 0  Doing errands, shopping? 0  Preparing Food and eating ? N  Using the Toilet? N  In the past six months, have you accidently leaked urine? N  Do you have problems with loss of bowel control? N  Managing your Medications? N  Managing your Finances? N  Housekeeping or managing your Housekeeping? N    Patient Care Team: Marin Olp, MD as PCP - General (Family Medicine)  Indicate  any recent Medical Services you may have received from other than [facility] providers in the past year (date may be approximate).     Assessment:   This is a routine wellness examination for Radhika.  Hearing/Vision screen Hearing Screening - Comments:: Pt denies hearing issues  Vision Screening - Comments:: Pt follows up with Dr Idolina Primer for annual eye exams   Dietary issues and exercise activities discussed: Current Exercise Habits: Home exercise routine, Type of exercise: walking;Other - see comments, Time (Minutes): > 60, Frequency (Times/Week): 6,  Weekly Exercise (Minutes/Week): 0   Goals Addressed             This Visit's Progress    Patient Stated       Stay healthy and active        Depression Screen    09/14/2022    3:33 PM 03/24/2022   10:00 AM 09/23/2021    9:11 AM 09/01/2021    2:27 PM 03/13/2021   11:18 AM 08/29/2020    1:20 PM 07/10/2020   11:58 AM  PHQ 2/9 Scores  PHQ - 2 Score 0 0 0 0 0 0 0  PHQ- 9 Score  0 0  0      Fall Risk    09/14/2022    3:35 PM 09/01/2021    2:29 PM 03/13/2021   11:18 AM 08/29/2020    1:20 PM  [place] in the past year? 0 0 0 0  Number falls in past yr: 0 0 0 0  Injury with Fall? 0 0 0 0  Risk for fall due to : Impaired vision Impaired vision No Fall Risks   Follow up Falls prevention discussed Falls prevention discussed Falls evaluation completed     FALL RISK PREVENTION PERTAINING TO THE HOME:  Any stairs in or around the home? Yes  If so, are there any without handrails? No  Home free of loose throw rugs in walkways, pet beds, electrical cords, etc? Yes  Adequate lighting in your home to reduce risk of falls? Yes   ASSISTIVE DEVICES UTILIZED TO PREVENT FALLS:  Life alert? No  Use of a cane, walker or w/c? No  Grab bars in the bathroom? Yes  Shower chair or bench in shower? Yes  Elevated toilet seat or a handicapped toilet? No   TIMED UP AND GO:  Was the test performed? No .   Cognitive Function:         09/01/2021    2:31 PM  6CIT Screen  What Year? 0 points  What month? 0 points  What time? 0 points  Count back from 20 0 points  Months in reverse 0 points  Repeat phrase 0 points  Total Score 0 points    Immunizations Immunization History  Administered Date(s) Administered   Fluad Quad(high Dose 65+) 04/25/2020   Influenza Split 04/22/2012   Influenza, High Dose Seasonal PF 04/28/2021, 03/28/2022   Influenza,inj,Quad PF,6+ Mos 04/25/2013, 05/28/2014, 04/05/2015, 05/07/2016, 04/26/2017, 05/18/2018, 05/09/2019   Influenza-Unspecified 06/01/2014   PFIZER Comirnaty(Gray Top)Covid-19 Tri-Sucrose Vaccine 08/05/2022   PFIZER(Purple Top)SARS-COV-2 Vaccination 10/20/2019, 11/14/2019, 03/25/2020, 11/14/2020, 04/11/2021   PNEUMOCOCCAL CONJUGATE-20 09/23/2021   Pfizer Covid-19 Vaccine Bivalent Booster 28yr & up 03/14/2022   Pneumococcal Polysaccharide-23 07/10/2020   Td 08/04/2003   Tdap 08/07/2014   Zoster Recombinat (Shingrix) 04/12/2019, 06/17/2019   Zoster, Live 06/17/2019    TDAP status: Up to date  Flu Vaccine status: Up to date  Pneumococcal vaccine status: Up to date  Covid-19 vaccine status: Completed vaccines  Qualifies for Shingles Vaccine? Yes   Zostavax completed Yes   Shingrix Completed?: Yes  Screening Tests Health Maintenance  Topic Date Due   Hepatitis C Screening  07/31/2098 (Originally 10/22/1973)   COVID-19 Vaccine (8 - 2023-24 season) 09/30/2022   Medicare Annual Wellness (AWV)  09/15/2023   MAMMOGRAM  07/23/2024   DTaP/Tdap/Td (3 - Td or Tdap) 08/07/2024   COLONOSCOPY (Pts 45-422yrInsurance coverage will need to be confirmed)  07/03/2026   Pneumonia Vaccine 6524Years old  Completed   INFLUENZA VACCINE  Completed   DEXA SCAN  Completed   Zoster Vaccines- Shingrix  Completed   HPV VACCINES  Aged Out    Health Maintenance  There are no preventive care reminders to display for this patient.   Colorectal cancer screening: Type of screening:  Colonoscopy. Completed 07/03/16. Repeat every 10 years  Mammogram status: Completed 07/23/22. Repeat every year  Bone Density status: Completed 08/06/20. Results reflect: Bone density results: NORMAL. Repeat every 2 years.   Additional Screening:   Vision Screening: Recommended annual ophthalmology exams for early detection of glaucoma and other disorders of the eye. Is the patient up to date with their annual eye exam?  Yes  Who is the provider or what is the name of the office in which the patient attends annual eye exams? Dr Idolina Primer If pt is not established with a provider, would they like to be referred to a provider to establish care? No .   Dental Screening: Recommended annual dental exams for proper oral hygiene  Community Resource Referral / Chronic Care Management: CRR required this visit?  No   CCM required this visit?  No      Plan:     I have personally reviewed and noted the following in the patient's chart:   Medical and social history Use of alcohol, tobacco or illicit drugs  Current medications and supplements including opioid prescriptions. Patient is not currently taking opioid prescriptions. Functional ability and status Nutritional status Physical activity Advanced directives List of other physicians Hospitalizations, surgeries, and ER visits in previous 12 months Vitals Screenings to include cognitive, depression, and falls Referrals and appointments  In addition, I have reviewed and discussed with patient certain preventive protocols, quality metrics, and best practice recommendations. A written personalized care plan for preventive services as well as general preventive health recommendations were provided to patient.     Willette Brace, LPN   [id number]   Nurse Notes: none

## 2022-09-14 NOTE — Patient Instructions (Signed)
Amber Jacobs , Thank you for taking time to come for your Medicare Wellness Visit. I appreciate your ongoing commitment to your health goals. Please review the following plan we discussed and let me know if I can assist you in the future.   These are the goals we discussed:  Goals      Patient Stated     Lose 5 lbs         This is a list of the screening recommended for you and due dates:  Health Maintenance  Topic Date Due   Hepatitis C Screening: USPSTF Recommendation to screen - Ages 18-79 yo.  07/31/2098*   COVID-19 Vaccine (8 - 2023-24 season) 09/30/2022   Medicare Annual Wellness Visit  09/15/2023   Mammogram  07/23/2024   DTaP/Tdap/Td vaccine (3 - Td or Tdap) 08/07/2024   Colon Cancer Screening  07/03/2026   Pneumonia Vaccine  Completed   Flu Shot  Completed   DEXA scan (bone density measurement)  Completed   Zoster (Shingles) Vaccine  Completed   HPV Vaccine  Aged Out  *Topic was postponed. The date shown is not the original due date.    Advanced directives: Please bring a copy of your health care power of attorney and living will to the office at your convenience.  Conditions/risks identified: stay healthy and active   Next appointment: Follow up in one year for your annual wellness visit    Preventive Care 65 Years and Older, Female Preventive care refers to lifestyle choices and visits with your health care provider that can promote health and wellness. What does preventive care include? A yearly physical exam. This is also called an annual well check. Dental exams once or twice a year. Routine eye exams. Ask your health care provider how often you should have your eyes checked. Personal lifestyle choices, including: Daily care of your teeth and gums. Regular physical activity. Eating a healthy diet. Avoiding tobacco and drug use. Limiting alcohol use. Practicing safe sex. Taking low-dose aspirin every day. Taking vitamin and mineral supplements as  recommended by your health care provider. What happens during an annual well check? The services and screenings done by your health care provider during your annual well check will depend on your age, overall health, lifestyle risk factors, and family history of disease. Counseling  Your health care provider may ask you questions about your: Alcohol use. Tobacco use. Drug use. Emotional well-being. Home and relationship well-being. Sexual activity. Eating habits. History of falls. Memory and ability to understand (cognition). Work and work Statistician. Reproductive health. Screening  You may have the following tests or measurements: Height, weight, and BMI. Blood pressure. Lipid and cholesterol levels. These may be checked every 5 years, or more frequently if you are over 77 years old. Skin check. Lung cancer screening. You may have this screening every year starting at age 50 if you have a 30-pack-year history of smoking and currently smoke or have quit within the past 15 years. Fecal occult blood test (FOBT) of the stool. You may have this test every year starting at age 57. Flexible sigmoidoscopy or colonoscopy. You may have a sigmoidoscopy every 5 years or a colonoscopy every 10 years starting at age 37. Hepatitis C blood test. Hepatitis B blood test. Sexually transmitted disease (STD) testing. Diabetes screening. This is done by checking your blood sugar (glucose) after you have not eaten for a while (fasting). You may have this done every 1-3 years. Bone density scan. This is done to  screen for osteoporosis. You may have this done starting at age 71. Mammogram. This may be done every 1-2 years. Talk to your health care provider about how often you should have regular mammograms. Talk with your health care provider about your test results, treatment options, and if necessary, the need for more tests. Vaccines  Your health care provider may recommend certain vaccines, such  as: Influenza vaccine. This is recommended every year. Tetanus, diphtheria, and acellular pertussis (Tdap, Td) vaccine. You may need a Td booster every 10 years. Zoster vaccine. You may need this after age 59. Pneumococcal 13-valent conjugate (PCV13) vaccine. One dose is recommended after age 38. Pneumococcal polysaccharide (PPSV23) vaccine. One dose is recommended after age 68. Talk to your health care provider about which screenings and vaccines you need and how often you need them. This information is not intended to replace advice given to you by your health care provider. Make sure you discuss any questions you have with your health care provider. Document Released: 08/16/2015 Document Revised: 04/08/2016 Document Reviewed: 05/21/2015 Elsevier Interactive Patient Education  2017 [place] Prevention in the Home Falls can cause injuries. They can happen to people of all ages. There are many things you can do to make your home safe and to help prevent falls. What can I do on the outside of my home? Regularly fix the edges of walkways and driveways and fix any cracks. Remove anything that might make you trip as you walk through a door, such as a raised step or threshold. Trim any bushes or trees on the path to your home. Use bright outdoor lighting. Clear any walking paths of anything that might make someone trip, such as rocks or tools. Regularly check to see if handrails are loose or broken. Make sure that both sides of any steps have handrails. Any raised decks and porches should have guardrails on the edges. Have any leaves, snow, or ice cleared regularly. Use sand or salt on walking paths during winter. Clean up any spills in your garage right away. This includes oil or grease spills. What can I do in the bathroom? Use night lights. Install grab bars by the toilet and in the tub and shower. Do not use towel bars as grab bars. Use non-skid mats or decals in the tub or  shower. If you need to sit down in the shower, use a plastic, non-slip stool. Keep the floor dry. Clean up any water that spills on the floor as soon as it happens. Remove soap buildup in the tub or shower regularly. Attach bath mats securely with double-sided non-slip rug tape. Do not have throw rugs and other things on the floor that can make you trip. What can I do in the bedroom? Use night lights. Make sure that you have a light by your bed that is easy to reach. Do not use any sheets or blankets that are too big for your bed. They should not hang down onto the floor. Have a firm chair that has side arms. You can use this for support while you get dressed. Do not have throw rugs and other things on the floor that can make you trip. What can I do in the kitchen? Clean up any spills right away. Avoid walking on wet floors. Keep items that you use a lot in easy-to-reach places. If you need to reach something above you, use a strong step stool that has a grab bar. Keep electrical cords out of the way.  Do not use floor polish or wax that makes floors slippery. If you must use wax, use non-skid floor wax. Do not have throw rugs and other things on the floor that can make you trip. What can I do with my stairs? Do not leave any items on the stairs. Make sure that there are handrails on both sides of the stairs and use them. Fix handrails that are broken or loose. Make sure that handrails are as long as the stairways. Check any carpeting to make sure that it is firmly attached to the stairs. Fix any carpet that is loose or worn. Avoid having throw rugs at the top or bottom of the stairs. If you do have throw rugs, attach them to the floor with carpet tape. Make sure that you have a light switch at the top of the stairs and the bottom of the stairs. If you do not have them, ask someone to add them for you. What else can I do to help prevent falls? Wear shoes that: Do not have high heels. Have  rubber bottoms. Are comfortable and fit you well. Are closed at the toe. Do not wear sandals. If you use a stepladder: Make sure that it is fully opened. Do not climb a closed stepladder. Make sure that both sides of the stepladder are locked into place. Ask someone to hold it for you, if possible. Clearly mark and make sure that you can see: Any grab bars or handrails. First and last steps. Where the edge of each step is. Use tools that help you move around (mobility aids) if they are needed. These include: Canes. Walkers. Scooters. Crutches. Turn on the lights when you go into a dark area. Replace any light bulbs as soon as they burn out. Set up your furniture so you have a clear path. Avoid moving your furniture around. If any of your floors are uneven, fix them. If there are any pets around you, be aware of where they are. Review your medicines with your doctor. Some medicines can make you feel dizzy. This can increase your chance of falling. Ask your doctor what other things that you can do to help prevent falls. This information is not intended to replace advice given to you by your health care provider. Make sure you discuss any questions you have with your health care provider. Document Released: 05/16/2009 Document Revised: 12/26/2015 Document Reviewed: 08/24/2014 Elsevier Interactive Patient Education  2017 [employer].

## 2022-09-16 NOTE — Progress Notes (Signed)
I connected with  Amber Jacobs on 09/16/22 by a audio enabled telemedicine application and verified that I am speaking with the correct person using two identifiers.  Patient Location: Home  Provider Location: Office/Clinic  I discussed the limitations of evaluation and management by telemedicine. The patient expressed understanding and agreed to proceed.   Subjective:   Amber Jacobs is a 67 y.o. female who presents for Medicare Annual (Subsequent) preventive examination.  Review of Systems     Cardiac Risk Factors include: advanced age (>22mn, >>68women)     Objective:    Today's Vitals   09/14/22 1530  Weight: 194 lb (88 kg)   Body mass index is 30.38 kg/m.     09/14/2022    3:34 PM 09/01/2021    2:28 PM  Advanced Directives  Does Patient Have a Medical Advance Directive? Yes No  Type of AParamedicof ASpiveyLiving will   Does patient want to make changes to medical advance directive?  Yes (MAU/Ambulatory/Procedural Areas - Information given)  [place]? No - copy requested     Current Medications (verified) Outpatient Encounter Medications as of 09/14/2022  Medication Sig   acyclovir (ZOVIRAX) 200 MG capsule TAKE 1 CAPSULE BY MOUTH FIVE TIMES DAILY   acyclovir ointment (ZOVIRAX) 5 % Apply 1 application. topically every 3 (three) hours.   COLLAGEN PO Take by mouth.   Multiple Vitamin (MULTIVITAMIN) tablet Take 1 tablet by mouth daily.   Omega-3 1000 MG CAPS Take 2,000 mg by mouth every morning.    sertraline (ZOLOFT) 50 MG tablet Take 1 tablet (50 mg total) by mouth daily.   simvastatin (ZOCOR) 40 MG tablet TAKE 1 TABLET DAILY OR AS DIRECTED   No facility-administered encounter medications on file as of 09/14/2022.    Allergies (verified) Fentanyl and Zocor [simvastatin]   History: Past Medical History:  Diagnosis Date   Cataract    Chronic kidney disease    Depression    stress    Heart murmur    born with   Hyperlipidemia    Past Surgical History:  Procedure Laterality Date   COLONOSCOPY     COSMETIC SURGERY     facial   fibroid tumors     KNEE ARTHROSCOPY     TONSILLECTOMY AND ADENOIDECTOMY     as a child   WISDOM TOOTH EXTRACTION     Family History  Adopted: Yes  Problem Relation Age of Onset   Alcohol abuse Mother        Patient was adopted and not much else known   Colon cancer Neg Hx    Social History   Socioeconomic History   Marital status: Married    Spouse name: Not on file   Number of children: Not on file   Years of education: Not on file   Highest education level: Not on file  Occupational History   Not on file  Tobacco Use   Smoking status: Never   Smokeless tobacco: Never  Substance and Sexual Activity   Alcohol use: Yes    Alcohol/week: 2.0 standard drinks of alcohol    Types: 2 Glasses of wine per week   Drug use: Yes    Comment: occasional marijuana perhaps once a month[place]  Sexual activity: Not on file  Other Topics Concern   Not on file  Social History Narrative   Married 2006 (together since 1984). No children.  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       Hobbies: gardener, gym, excellent chef   Social Determinants of Health   Financial Resource Strain: Low Risk  (09/14/2022)   Overall Financial Resource Strain (CARDIA)    Difficulty of Paying Living Expenses: Not hard at all  Food Insecurity: No Food Insecurity (09/14/2022)   Hunger Vital Sign    Worried About Running Out of Food in the Last Year: Never true    Ran Out of Food in the Last Year: Never true  Transportation Needs: No Transportation Needs (09/14/2022)   PRAPARE - Hydrologist (Medical): No    Lack of Transportation (Non-Medical): No  Physical Activity:  Sufficiently Active (09/14/2022)   Exercise Vital Sign    Days of Exercise per Week: 6 days    Minutes of Exercise per Session: 90 min  Stress: No Stress Concern Present (09/14/2022)   [place]    Feeling of Stress : Not at all  Social Connections: Moderately Integrated (09/14/2022)   Social Connection and Isolation Panel [NHANES]    Frequency of Communication with Friends and Family: More than three times a week    Frequency of Social Gatherings with Friends and Family: More than three times a week    Attends Religious Services: Never    Marine scientist or Organizations: Yes    Attends Music therapist: 1 to 4 times per year    Marital Status: Married    Tobacco Counseling Counseling given: Not Answered   Clinical Intake:  Pre-visit preparation completed: Yes  Pain : No/denies pain     BMI - recorded: 30.38 Nutritional Status: BMI > 30  Obese Nutritional Risks: None Diabetes: No  How often do you need to have someone help you when you read instructions, pamphlets, or other written materials from your doctor or pharmacy?: 1 - Never  Diabetic?no  Interpreter Needed?: No  Information entered by :: Charlott Rakes, LPN   Activities of Daily Living    09/14/2022    3:36 PM  In your present state of health, do you have any difficulty performing the following activities:  Hearing? 0  Vision? 0  Difficulty concentrating or making decisions? 0  Walking or climbing stairs? 0  Dressing or bathing? 0  Doing errands, shopping? 0  Preparing Food and eating ? N  Using the Toilet? N  In the past six months, have you accidently leaked urine? N  Do you have problems with loss of bowel control? N  Managing your Medications? N  Managing your Finances? N  Housekeeping or managing your Housekeeping? N    Patient Care Team: Marin Olp, MD as PCP - General (Family Medicine)  Indicate  any recent Medical Services you may have received from other than [facility] providers in the past year (date may be approximate).     Assessment:   This is a routine wellness examination for Future.  Hearing/Vision screen Hearing Screening - Comments:: Pt denies hearing issues  Vision Screening - Comments:: Pt follows up with Dr Idolina Primer for annual eye exams   Dietary issues and exercise activities discussed: Current Exercise Habits: Home exercise routine, Type of exercise: walking;Other - see comments, Time (Minutes): > 60, Frequency (Times/Week): 6,  Weekly Exercise (Minutes/Week): 0   Goals Addressed             This Visit's Progress    Patient Stated       Stay healthy and active       Depression Screen    09/14/2022    3:33 PM 03/24/2022   10:00 AM 09/23/2021    9:11 AM 09/01/2021    2:27 PM 03/13/2021   11:18 AM 08/29/2020    1:20 PM 07/10/2020   11:58 AM  PHQ 2/9 Scores  PHQ - 2 Score 0 0 0 0 0 0 0  PHQ- 9 Score  0 0  0      Fall Risk    09/14/2022    3:35 PM 09/01/2021    2:29 PM 03/13/2021   11:18 AM 08/29/2020    1:20 PM  [place] in the past year? 0 0 0 0  Number falls in past yr: 0 0 0 0  Injury with Fall? 0 0 0 0  Risk for fall due to : Impaired vision Impaired vision No Fall Risks   Follow up Falls prevention discussed Falls prevention discussed Falls evaluation completed     FALL RISK PREVENTION PERTAINING TO THE HOME:  Any stairs in or around the home? Yes  If so, are there any without handrails? No  Home free of loose throw rugs in walkways, pet beds, electrical cords, etc? Yes  Adequate lighting in your home to reduce risk of falls? Yes   ASSISTIVE DEVICES UTILIZED TO PREVENT FALLS:  Life alert? No  Use of a cane, walker or w/c? No  Grab bars in the bathroom? Yes  Shower chair or bench in shower? Yes  Elevated toilet seat or a handicapped toilet? No   TIMED UP AND GO:  Was the test performed? No .   Cognitive Function:         09/14/2022    3:40 PM 09/01/2021    2:31 PM  6CIT Screen  What Year? 0 points 0 points  What month? 0 points 0 points  What time? 0 points 0 points  Count back from 20 0 points 0 points  Months in reverse 0 points 0 points  Repeat phrase 0 points 0 points  Total Score 0 points 0 points    Immunizations Immunization History  Administered Date(s) Administered   Fluad Quad(high Dose 65+) 04/25/2020   Influenza Split 04/22/2012   Influenza, High Dose Seasonal PF 04/28/2021, 03/28/2022   Influenza,inj,Quad PF,6+ Mos 04/25/2013, 05/28/2014, 04/05/2015, 05/07/2016, 04/26/2017, 05/18/2018, 05/09/2019   Influenza-Unspecified 06/01/2014   PFIZER Comirnaty(Gray Top)Covid-19 Tri-Sucrose Vaccine 08/05/2022   PFIZER(Purple Top)SARS-COV-2 Vaccination 10/20/2019, 11/14/2019, 03/25/2020, 11/14/2020, 04/11/2021   PNEUMOCOCCAL CONJUGATE-20 09/23/2021   Pfizer Covid-19 Vaccine Bivalent Booster 43yr & up 03/14/2022   Pneumococcal Polysaccharide-23 07/10/2020   Td 08/04/2003   Tdap 08/07/2014   Zoster Recombinat (Shingrix) 04/12/2019, 06/17/2019   Zoster, Live 06/17/2019    TDAP status: Up to date  Flu Vaccine status: Up to date  Pneumococcal vaccine status: Up to date  Covid-19 vaccine status: Completed vaccines  Qualifies for Shingles Vaccine? Yes   Zostavax completed Yes   Shingrix Completed?: Yes  Screening Tests Health Maintenance  Topic Date Due   Hepatitis C Screening  07/31/2098 (Originally 10/22/1973)   COVID-19 Vaccine (8 - 2023-24 season) 09/30/2022   Medicare Annual Wellness (AWV)  09/15/2023   MAMMOGRAM  07/23/2024   DTaP/Tdap/Td (3 - Td or Tdap) 08/07/2024   COLONOSCOPY (  Pts 45-65yr Insurance coverage will need to be confirmed)  07/03/2026   Pneumonia Vaccine 68 Years old  Completed   INFLUENZA VACCINE  Completed   DEXA SCAN  Completed   Zoster Vaccines- Shingrix  Completed   HPV VACCINES  Aged Out    Health Maintenance  There are no preventive care reminders to  display for this patient.   Colorectal cancer screening: Type of screening: Colonoscopy. Completed 07/03/16. Repeat every 10 years  Mammogram status: Completed 07/23/22. Repeat every year  Bone Density status: Completed 08/06/20. Results reflect: Bone density results: NORMAL. Repeat every 2 years.   Additional Screening:   Vision Screening: Recommended annual ophthalmology exams for early detection of glaucoma and other disorders of the eye. Is the patient up to date with their annual eye exam?  Yes  Who is the provider or what is the name of the office in which the patient attends annual eye exams? Dr DIdolina PrimerIf pt is not established with a provider, would they like to be referred to a provider to establish care? No .   Dental Screening: Recommended annual dental exams for proper oral hygiene  Community Resource Referral / Chronic Care Management: CRR required this visit?  No   CCM required this visit?  No      Plan:     I have personally reviewed and noted the following in the patient's chart:   Medical and social history Use of alcohol, tobacco or illicit drugs  Current medications and supplements including opioid prescriptions. Patient is not currently taking opioid prescriptions. Functional ability and status Nutritional status Physical activity Advanced directives List of other physicians Hospitalizations, surgeries, and ER visits in previous 12 months Vitals Screenings to include cognitive, depression, and falls Referrals and appointments  In addition, I have reviewed and discussed with patient certain preventive protocols, quality metrics, and best practice recommendations. A written personalized care plan for preventive services as well as general preventive health recommendations were provided to patient.     TWillette Brace LPN   [id number]  Nurse Notes: none

## 2022-09-21 DIAGNOSIS — Z124 Encounter for screening for malignant neoplasm of cervix: Secondary | ICD-10-CM | POA: Diagnosis not present

## 2022-09-24 ENCOUNTER — Ambulatory Visit (INDEPENDENT_AMBULATORY_CARE_PROVIDER_SITE_OTHER): Payer: Medicare Other | Admitting: Family Medicine

## 2022-09-24 ENCOUNTER — Encounter: Payer: Self-pay | Admitting: Family Medicine

## 2022-09-24 VITALS — BP 120/70 | HR 87 | Temp 97.4°F | Ht 67.0 in | Wt 194.2 lb

## 2022-09-24 DIAGNOSIS — R739 Hyperglycemia, unspecified: Secondary | ICD-10-CM

## 2022-09-24 DIAGNOSIS — N1831 Chronic kidney disease, stage 3a: Secondary | ICD-10-CM | POA: Diagnosis not present

## 2022-09-24 DIAGNOSIS — E785 Hyperlipidemia, unspecified: Secondary | ICD-10-CM | POA: Diagnosis not present

## 2022-09-24 NOTE — Patient Instructions (Addendum)
Schedule a lab visit at the check out desk within 2 weeks. Return for future fasting labs meaning nothing but water after midnight please. Ok to take your medications with water.   Recommended follow up: Return in about 6 months (around 03/25/2023) for followup or sooner if needed.Schedule b4 you leave.

## 2022-09-24 NOTE — Progress Notes (Signed)
Phone [phone] In person visit   Subjective:   Amber Jacobs is a 67 y.o. year old very pleasant female patient who presents for/with See problem oriented charting Chief Complaint  Patient presents with   Follow-up    (NO MASK)   Hyperlipidemia    Past Medical History-  Patient Active Problem List   Diagnosis Date Noted   Alopecia areata 10/27/2018    Priority: Medium    Asthma 10/27/2018    Priority: Medium    Genital herpes 05/07/2016    Priority: Medium    CKD (chronic kidney disease), stage III ([place]) 08/07/2014    Priority: Medium    Hyperglycemia 08/07/2014    Priority: Medium    Hyperlipidemia 04/02/2008    Priority: Medium    Tendon rupture, nontraumatic, extensor 03/21/2015    Priority: Low   Hormone replacement therapy 11/01/2014    Priority: Low   Stress at work 08/07/2014    Priority: Low   Injury of toenail 06/13/2014    Priority: Low   Hematuria 05/02/2010    Priority: Low   Strain of left pectoralis muscle 09/20/2015   Chest pain 09/20/2015    Medications- reviewed and updated Current Outpatient Medications  Medication Sig Dispense Refill   acyclovir (ZOVIRAX) 200 MG capsule TAKE 1 CAPSULE BY MOUTH FIVE TIMES DAILY 25 capsule 3   acyclovir ointment (ZOVIRAX) 5 % Apply 1 application. topically every 3 (three) hours. 30 g 1   COLLAGEN PO Take by mouth.     Multiple Vitamin (MULTIVITAMIN) tablet Take 1 tablet by mouth daily.     Omega-3 1000 MG CAPS Take 2,000 mg by mouth every morning.      sertraline (ZOLOFT) 50 MG tablet Take 1 tablet (50 mg total) by mouth daily. 90 tablet 3   simvastatin (ZOCOR) 40 MG tablet TAKE 1 TABLET DAILY OR AS DIRECTED 90 tablet 3   No current facility-administered medications for this visit.     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  Still enjoying working at [employer].  -Has been on sertraline for situational stress.  Last visit had wanted to maintain this medication.  Also using calm app or others.   -worried about husband some- CHF and more forgetful   #hyperlipidemia S: Medication: Simvastatin 40 mg Lab Results  Component Value Date   CHOL 167 09/23/2021   HDL 36.10 (L) 09/23/2021   LDLCALC 95 09/23/2021   LDLDIRECT 116.0 05/09/2019   TRIG 179.0 (H) 09/23/2021   CHOLHDL 5 09/23/2021   A/P: Has done well with over 30% reduction from peak LDL-we have been cautious about increasing statins due to prediabetes risk but she is due for repeat lipid panel- will come back   #Chronic kidney disease stage III S: GFR is typically in the 50s range- 60s last visit- some oscillation -Patient knows to avoid NSAIDs - only tylenol A/P: hopefully stable- update cmp with labs. Continue current meds for now    # Hyperglycemia/insulin resistance/prediabetes S:  Medication: none Exercise and  diet-weight up 6 pounds from last visit. Winter is harder for her- plans to get it back down as warmer months come in Lab Results  Component Value Date   HGBA1C 6.0 03/24/2022   HGBA1C 6.0 09/23/2021   HGBA1C 6.0 03/13/2021   A/P: Hopefuly stable or improved- update a1c with labs. Continue current meds for now.  Recommended follow up: Return in about 6 months (around 03/25/2023) for followup or sooner if needed.Schedule b4 you leave. Future Appointments  Date Time Provider [place]  09/27/2023  3:00 PM [facility]    Lab/Order associations:   ICD-10-CM   1. Hyperglycemia  R73.9 Hemoglobin A1c    2. Hyperlipidemia, unspecified hyperlipidemia type  E78.5 Comprehensive metabolic panel    CBC with  Differential/Platelet    Lipid panel    3. Stage 3a chronic kidney disease (HCC)  N18.31       No orders of the defined types were placed in this encounter.   Return precautions advised.  Garret Reddish, MD

## 2022-09-25 LAB — HM PAP SMEAR

## 2022-09-29 ENCOUNTER — Other Ambulatory Visit (INDEPENDENT_AMBULATORY_CARE_PROVIDER_SITE_OTHER): Payer: Medicare Other

## 2022-09-29 DIAGNOSIS — E785 Hyperlipidemia, unspecified: Secondary | ICD-10-CM | POA: Diagnosis not present

## 2022-09-29 DIAGNOSIS — R739 Hyperglycemia, unspecified: Secondary | ICD-10-CM | POA: Diagnosis not present

## 2022-09-29 LAB — COMPREHENSIVE METABOLIC PANEL
ALT: 21 U/L (ref 0–35)
AST: 22 U/L (ref 0–37)
Albumin: 4.2 g/dL (ref 3.5–5.2)
Alkaline Phosphatase: 62 U/L (ref 39–117)
BUN: 21 mg/dL (ref 6–23)
CO2: 26 mEq/L (ref 19–32)
Calcium: 9.5 mg/dL (ref 8.4–10.5)
Chloride: 105 mEq/L (ref 96–112)
Creatinine, Ser: 0.97 mg/dL (ref 0.40–1.20)
GFR: 60.29 mL/min (ref >60.00)
Glucose, Bld: 110 mg/dL — ABNORMAL HIGH (ref 70–99)
Potassium: 4.5 mEq/L (ref 3.5–5.1)
Sodium: 139 mEq/L (ref 135–145)
Total Bilirubin: 0.5 mg/dL (ref 0.2–1.2)
Total Protein: 6.8 g/dL (ref 6.0–8.3)

## 2022-09-29 LAB — CBC WITH DIFFERENTIAL/PLATELET
Basophils Absolute: 0 10*3/uL (ref 0.0–0.1)
Basophils Relative: 0.6 % (ref 0.0–3.0)
Eosinophils Absolute: 0.2 10*3/uL (ref 0.0–0.7)
Eosinophils Relative: 4 % (ref 0.0–5.0)
HCT: 40 % (ref 36.0–46.0)
Hemoglobin: 13.6 g/dL (ref 12.0–15.0)
Lymphocytes Relative: 33.6 % (ref 12.0–46.0)
Lymphs Abs: 1.6 10*3/uL (ref 0.7–4.0)
MCHC: 34 g/dL (ref 30.0–36.0)
MCV: 89.2 fl (ref 78.0–100.0)
Monocytes Absolute: 0.3 10*3/uL (ref 0.1–1.0)
Monocytes Relative: 6.1 % (ref 3.0–12.0)
Neutro Abs: 2.7 10*3/uL (ref 1.4–7.7)
Neutrophils Relative %: 55.7 % (ref 43.0–77.0)
Platelets: 243 10*3/uL (ref 150.0–400.0)
RBC: 4.48 Mil/uL (ref 3.87–5.11)
RDW: 13.9 % (ref 11.5–15.5)
WBC: 4.8 10*3/uL (ref 4.0–10.5)

## 2022-09-29 LAB — LIPID PANEL
Cholesterol: 175 mg/dL (ref 0–200)
HDL: 43.3 mg/dL (ref >39.00)
LDL Cholesterol: 101 mg/dL — ABNORMAL HIGH (ref 0–99)
NonHDL: 131.93
Total CHOL/HDL Ratio: 4
Triglycerides: 156 mg/dL — ABNORMAL HIGH (ref 0.0–149.0)
VLDL: 31.2 mg/dL (ref 0.0–40.0)

## 2022-09-29 LAB — HEMOGLOBIN A1C: Hgb A1c MFr Bld: 5.9 % (ref 4.6–6.5)

## 2023-02-18 ENCOUNTER — Encounter: Payer: Self-pay | Admitting: Family Medicine

## 2023-03-03 ENCOUNTER — Encounter (INDEPENDENT_AMBULATORY_CARE_PROVIDER_SITE_OTHER): Payer: Self-pay

## 2023-03-04 ENCOUNTER — Other Ambulatory Visit: Payer: Self-pay | Admitting: Family Medicine

## 2023-03-23 ENCOUNTER — Encounter: Payer: Self-pay | Admitting: Family Medicine

## 2023-03-23 ENCOUNTER — Ambulatory Visit: Payer: Medicare Other | Admitting: Family Medicine

## 2023-03-23 VITALS — BP 104/62 | HR 65 | Temp 98.2°F | Ht 67.0 in | Wt 190.4 lb

## 2023-03-23 DIAGNOSIS — E785 Hyperlipidemia, unspecified: Secondary | ICD-10-CM | POA: Diagnosis not present

## 2023-03-23 DIAGNOSIS — R739 Hyperglycemia, unspecified: Secondary | ICD-10-CM

## 2023-03-23 DIAGNOSIS — N1831 Chronic kidney disease, stage 3a: Secondary | ICD-10-CM | POA: Diagnosis not present

## 2023-03-23 DIAGNOSIS — Z131 Encounter for screening for diabetes mellitus: Secondary | ICD-10-CM | POA: Diagnosis not present

## 2023-03-23 LAB — COMPREHENSIVE METABOLIC PANEL
ALT: 22 U/L (ref 0–35)
AST: 25 U/L (ref 0–37)
Albumin: 4.6 g/dL (ref 3.5–5.2)
Alkaline Phosphatase: 58 U/L (ref 39–117)
BUN: 17 mg/dL (ref 6–23)
CO2: 26 mEq/L (ref 19–32)
Calcium: 9.4 mg/dL (ref 8.4–10.5)
Chloride: 106 mEq/L (ref 96–112)
Creatinine, Ser: 1.04 mg/dL (ref 0.40–1.20)
GFR: 55.26 mL/min — ABNORMAL LOW (ref >60.00)
Glucose, Bld: 117 mg/dL — ABNORMAL HIGH (ref 70–99)
Potassium: 4.1 mEq/L (ref 3.5–5.1)
Sodium: 140 mEq/L (ref 135–145)
Total Bilirubin: 0.6 mg/dL (ref 0.2–1.2)
Total Protein: 7.1 g/dL (ref 6.0–8.3)

## 2023-03-23 LAB — HEMOGLOBIN A1C: Hgb A1c MFr Bld: 5.9 % (ref 4.6–6.5)

## 2023-03-23 LAB — LDL CHOLESTEROL, DIRECT: Direct LDL: 124 mg/dL

## 2023-03-23 NOTE — Progress Notes (Signed)
Phone [phone] In person visit   Subjective:   Amber Jacobs is a 67 y.o. year old very pleasant female patient who presents for/with See problem oriented charting Chief Complaint  Patient presents with   Medical Management of Chronic Issues   Hyperlipidemia   Past Medical History-  Patient Active Problem List   Diagnosis Date Noted   Alopecia areata 10/27/2018    Priority: Medium    Asthma 10/27/2018    Priority: Medium    Genital herpes 05/07/2016    Priority: Medium    CKD (chronic kidney disease), stage III (HCC) 08/07/2014    Priority: Medium    Hyperglycemia 08/07/2014    Priority: Medium    Hyperlipidemia 04/02/2008    Priority: Medium    Tendon rupture, nontraumatic, extensor 03/21/2015    Priority: Low   Hormone replacement therapy 11/01/2014    Priority: Low   Stress at work 08/07/2014    Priority: Low   Injury of toenail 06/13/2014    Priority: Low   Hematuria 05/02/2010    Priority: Low   Strain of left pectoralis muscle 09/20/2015   Chest pain 09/20/2015    Medications- reviewed and updated Current Outpatient Medications  Medication Sig Dispense Refill   acyclovir (ZOVIRAX) 200 MG capsule TAKE 1 CAPSULE BY MOUTH FIVE TIMES DAILY 25 capsule 3   COLLAGEN PO Take by mouth.     Multiple Vitamin (MULTIVITAMIN) tablet Take 1 tablet by mouth daily.     Omega-3 1000 MG CAPS Take 2,000 mg by mouth every morning.      sertraline (ZOLOFT) 50 MG tablet Take 1 tablet (50 mg total) by mouth daily. 90 tablet 3   simvastatin (ZOCOR) 40 MG tablet TAKE 1 TABLET DAILY OR AS DIRECTED 90 tablet 3   No current facility-administered medications for this visit.     Objective:  BP 104/62   Pulse 65   Temp 98.2 F (36.8 C)   Ht 5\' 7"  (1.702 m)   Wt 190 lb 6.4 oz (86.4 kg)   SpO2 96%   BMI 29.82 kg/m  Gen: NAD, resting comfortably CV: RRR no murmurs rubs or gallops Lungs: CTAB no crackles, wheeze, rhonchi Ext: no edema Skin: warm, dry      Assessment and Plan   #social update- 5 day cruise coming up. October 1 until end of December . She is also going to work at [employer]- between two - seventy hours a week  #hyperlipidemia S: Medication:Simvastatin 40 mg-has produced over 30% reduction from peak LDL - We have been cautious about increasing further due to prediabetes risk  Lab Results  Component Value Date   CHOL 175 09/29/2022   HDL 43.30 09/29/2022   LDLCALC 101 (H) 09/29/2022   LDLDIRECT 116.0 05/09/2019   TRIG 156.0 (H) 09/29/2022   CHOLHDL 4 09/29/2022  A/P: lipids hopefully stable or improve- update LDL today and continue current medications   # Chronic kidney disease stage III S: GFR typically in the 50s but can oscillate into the 60s - Knows to avoid NSAIDs-only uses Tylenol  -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 Feels could improve  her diet- has cut down on potato chips but misses them, cut down on starches at supper  Lab Results  Component Value Date   HGBA1C 5.9 09/29/2022   HGBA1C 6.0 03/24/2022   HGBA1C 6.0 09/23/2021  A/P: hopefully stable- update a1c today. Continue without meds for now   #situational stress- started on sertraline 50mg  with work- has wanted to continue - controlled   Recommended follow up: Return in about 6 months (around 09/23/2023) for followup or sooner if needed.Schedule b4 you leave. Future Appointments  Date Time Provider Department Center  09/27/2023  3:00 PM [facility] ANNUAL WELLNESS VISIT 1 [facility]   Lab/Order associations:   ICD-10-CM   1. Hyperlipidemia, unspecified hyperlipidemia type  E78.5  Comprehensive metabolic panel    LDL cholesterol, direct    2. Hyperglycemia  R73.9 Hemoglobin A1c    3. Stage 3a chronic kidney disease (HCC)  N18.31 Comprehensive metabolic panel    4. Screening for diabetes mellitus  Z13.1 Hemoglobin A1c      No orders of the defined types were placed in this encounter.   Return precautions advised.  Tana Conch, MD

## 2023-03-23 NOTE — Patient Instructions (Addendum)
Let us know if you get your flu or COVID vaccine this fall.  Please stop by lab before you go If you have mychart- we will send your results within 3 business days of [place] receiving them.  If you do not have mychart- we will call you about results within 5 business days of [place] receiving them.  *please also note that you will see labs on mychart as soon as they post. I will later go in and write notes on them- will say "notes from Dr. Durene Cal"   Recommended follow up: Return in about 6 months (around 09/23/2023) for followup or sooner if needed.Schedule b4 you leave.

## 2023-03-31 DIAGNOSIS — Z23 Encounter for immunization: Secondary | ICD-10-CM | POA: Diagnosis not present

## 2023-04-01 ENCOUNTER — Encounter: Payer: Self-pay | Admitting: Family Medicine

## 2023-04-21 ENCOUNTER — Encounter: Payer: Self-pay | Admitting: Family

## 2023-04-21 ENCOUNTER — Ambulatory Visit (INDEPENDENT_AMBULATORY_CARE_PROVIDER_SITE_OTHER): Payer: Medicare Other | Admitting: Family

## 2023-04-21 VITALS — BP 128/77 | HR 67 | Temp 98.0°F | Ht 67.0 in | Wt 193.2 lb

## 2023-04-21 DIAGNOSIS — R053 Chronic cough: Secondary | ICD-10-CM | POA: Diagnosis not present

## 2023-04-21 MED ORDER — BENZONATATE 100 MG PO CAPS
100.0000 mg | ORAL_CAPSULE | Freq: Three times a day (TID) | ORAL | 0 refills | Status: AC | PRN
Start: 2023-04-21 — End: 2023-05-11

## 2023-04-21 NOTE — Progress Notes (Signed)
Patient ID: Mairany Dilday, female    DOB: 1956-02-19, 67 y.o.   MRN: [id number]  Chief Complaint  Patient presents with   Cough    sx for 3d   *Discussed the use of AI scribe software for clinical note transcription with the patient, who gave verbal consent to proceed.  HPI:      URI sx:  Donyetta, a patient with a history of exercise-induced asthma, presents with a sore throat and cough that started a few days ago. She reports that her voice went out this morning. Initially, she thought she was getting a head cold, but she has no congestion. She describes a unique pain in her ear and difficulty swallowing, even hard to swallow Tylenol. Her throat is sore, and she has a cough, but it is not persistent unless she forces it. She describes it as a dry cough. She tested negative for COVID-19. Her husband was recently diagnosed with bronchitis. She has been managing her symptoms with Tylenol and Delsym syrup. Her primary complaint is her sore throat and ear pain. She has not had a cold in approximately 15 years.      Assessment & Plan:  Upper Respiratory Infection -   Sore throat, ear pain, and cough. No congestion. No fever. No history of strep throat. Husband diagnosed with bronchitis. No signs of bacterial infection on examination. -Continue Tylenol and Delsym as needed for symptom relief. -Start Aleve twice daily for a few days for inflammation and pain. -Start Tessalon Perles for cough. -Encouraged to maintain hydration and consider hot tea with honey for symptomatic relief. -Consider steroids if symptoms persist into next week.      Subjective:    Outpatient Medications Prior to Visit  Medication Sig Dispense Refill   acyclovir (ZOVIRAX) 200 MG capsule TAKE 1 CAPSULE BY MOUTH FIVE TIMES DAILY 25 capsule 3   COLLAGEN PO Take by mouth.     Multiple Vitamin (MULTIVITAMIN) tablet Take 1 tablet by mouth daily.     Omega-3 1000 MG CAPS Take 2,000 mg by mouth every morning.       sertraline (ZOLOFT) 50 MG tablet Take 1 tablet (50 mg total) by mouth daily. 90 tablet 3   simvastatin (ZOCOR) 40 MG tablet TAKE 1 TABLET DAILY OR AS DIRECTED 90 tablet 3   No facility-administered medications prior to visit.   Past Medical History:  Diagnosis Date   Cataract    Chronic kidney disease    Depression    stress   Heart murmur    born with   Hyperlipidemia    Past Surgical History:  Procedure Laterality Date   COLONOSCOPY     COSMETIC SURGERY     facial   fibroid tumors     KNEE ARTHROSCOPY     TONSILLECTOMY AND ADENOIDECTOMY     as a child   WISDOM TOOTH EXTRACTION     Allergies  Allergen Reactions   Fentanyl Nausea Only   Zocor [Simvastatin]     Muscle aches and pains      Objective:    Physical Exam Vitals and nursing note reviewed.  Constitutional:      Appearance: Normal appearance. She is not ill-appearing.     Interventions: Face mask in place.  HENT:     Right Ear: Tympanic membrane and ear canal normal.     Left Ear: Tympanic membrane and ear canal normal.     Nose:     Right Sinus: Frontal sinus tenderness present.  Left Sinus: Frontal sinus tenderness present.     Mouth/Throat:     Mouth: Mucous membranes are moist.     Pharynx: Posterior oropharyngeal erythema present. No pharyngeal swelling, oropharyngeal exudate or uvula swelling.     Tonsils: No tonsillar exudate or tonsillar abscesses.  Cardiovascular:     Rate and Rhythm: Normal rate and regular rhythm.  Pulmonary:     Effort: Pulmonary effort is normal.     Breath sounds: Normal breath sounds.  Musculoskeletal:        General: Normal range of motion.  Lymphadenopathy:     Head:     Right side of head: No preauricular or posterior auricular adenopathy.     Left side of head: No preauricular or posterior auricular adenopathy.     Cervical: Cervical adenopathy present.     Right cervical: Superficial cervical adenopathy present.     Left cervical: Superficial cervical  adenopathy present.  Skin:    General: Skin is warm and dry.  Neurological:     Mental Status: She is alert.  Psychiatric:        Mood and Affect: Mood normal.        Behavior: Behavior normal.    BP 128/77 (BP Location: Left Arm, Patient Position: Sitting, Cuff Size: Large)   Pulse 67   Temp 98 F (36.7 C) (Temporal)   Ht 5\' 7"  (1.702 m)   Wt 193 lb 3.2 oz (87.6 kg)   SpO2 95%   BMI 30.26 kg/m  Wt Readings from Last 3 Encounters:  04/21/23 193 lb 3.2 oz (87.6 kg)  03/23/23 190 lb 6.4 oz (86.4 kg)  09/24/22 194 lb 3.2 oz (88.1 kg)       Dulce Sellar, NP

## 2023-05-05 ENCOUNTER — Other Ambulatory Visit (HOSPITAL_BASED_OUTPATIENT_CLINIC_OR_DEPARTMENT_OTHER): Payer: Self-pay

## 2023-05-05 DIAGNOSIS — Z23 Encounter for immunization: Secondary | ICD-10-CM | POA: Diagnosis not present

## 2023-05-05 MED ORDER — FLUAD 0.5 ML IM SUSY
0.5000 mL | PREFILLED_SYRINGE | Freq: Once | INTRAMUSCULAR | 0 refills | Status: AC
  Filled 2023-05-05: qty 0.5, 1d supply, fill #0

## 2023-06-21 ENCOUNTER — Other Ambulatory Visit: Payer: Self-pay | Admitting: Family Medicine

## 2023-07-15 DIAGNOSIS — H35372 Puckering of macula, left eye: Secondary | ICD-10-CM | POA: Diagnosis not present

## 2023-07-16 ENCOUNTER — Telehealth: Payer: Self-pay | Admitting: Family Medicine

## 2023-07-16 MED ORDER — ACYCLOVIR 200 MG PO CAPS: ORAL_CAPSULE | ORAL | 3 refills | Status: AC

## 2023-07-16 NOTE — Telephone Encounter (Signed)
Prescription Request  07/16/2023  LOV: 03/23/2023  What is the name of the medication or equipment? acyclovir (ZOVIRAX) 200 MG capsule   Have you contacted your pharmacy to request a refill? Yes   Which pharmacy would you like this sent to?    [id number] - [place], [place] - [address] [place] [address] [place] [address] [place] [address] [place] [address] Phone: [phone] Fax: [phone]    Patient notified that their request is being sent to the clinical staff for review and that they should receive a response within 2 business days.   Please advise at Mobile [phone] (mobile)

## 2023-07-16 NOTE — Telephone Encounter (Signed)
Refill sent to pharmacy.   

## 2023-08-10 DIAGNOSIS — Z1231 Encounter for screening mammogram for malignant neoplasm of breast: Secondary | ICD-10-CM | POA: Diagnosis not present

## 2023-08-10 LAB — HM MAMMOGRAPHY

## 2023-08-31 NOTE — Telephone Encounter (Signed)
Copied from CRM [id number]. Topic: Clinical - Medication Question >> Aug 31, 2023 10:33 AM [name] wrote: Reason for CRM: Patient requesting motion sickness medication for 5-day cruise she is leaving for on Friday. Would like the prescription sent to the [facility] pharmacy on file. CB# [phone]

## 2023-09-02 ENCOUNTER — Telehealth: Payer: Self-pay

## 2023-09-02 MED ORDER — SCOPOLAMINE 1 MG/3DAYS TD PT72: 1.0000 | MEDICATED_PATCH | TRANSDERMAL | 0 refills | Status: DC

## 2023-09-02 NOTE — Telephone Encounter (Signed)
Message received yesterday after I left. Reached out to pt and got vm, unsure if this still needs to be sent to pharmacy since message states she was leaving this morning at 8am.  Copied from CRM [id number]. Topic: Clinical - Medication Question >> Sep 01, 2023  3:26 PM [name] wrote: Reason for CRM: Patient is leaving tomorrow instead of Friday and wants to know if she can get her motion sickness medication today. Patient is leaving tomorrow at 8:00 am.

## 2023-09-02 NOTE — Addendum Note (Signed)
Addended by: Shelva Majestic on: 09/02/2023 08:41 AM   Modules accepted: Orders

## 2023-09-02 NOTE — Telephone Encounter (Signed)
I sent in  just now-please call and tell patient we did not unfortunately receive the message until late yesterday and we were out of the office-we apologize if she was not able to pick up

## 2023-09-02 NOTE — Telephone Encounter (Signed)
Medication requested was sent in this morning due to not receiving message until late 09/01/23. Nothing further needed at this time

## 2023-09-23 ENCOUNTER — Ambulatory Visit: Payer: TRICARE For Life (TFL) | Admitting: Family Medicine

## 2023-09-27 ENCOUNTER — Ambulatory Visit (INDEPENDENT_AMBULATORY_CARE_PROVIDER_SITE_OTHER): Payer: TRICARE For Life (TFL)

## 2023-09-27 VITALS — Wt 193.0 lb

## 2023-09-27 DIAGNOSIS — Z0000 Encounter for general adult medical examination without abnormal findings: Secondary | ICD-10-CM

## 2023-09-27 NOTE — Progress Notes (Addendum)
 Subjective:   Amber Jacobs is a 68 y.o. who presents for a Medicare Wellness preventive visit.  Visit Complete: Virtual I connected with  Jaymes Graff on 09/27/23 by a video and audio enabled telemedicine application and verified that I am speaking with the correct person using two identifiers.  Patient Location: Home  Provider Location: Office/Clinic  I discussed the limitations of evaluation and management by telemedicine. The patient expressed understanding and agreed to proceed.  Vital Signs: Because this visit was a virtual/telehealth visit, some criteria may be missing or patient reported. Any vitals not documented were not able to be obtained and vitals that have been documented are patient reported.    AWV Questionnaire: Yes: Patient Medicare AWV questionnaire was completed by the patient on 09/23/23; I have confirmed that all information answered by patient is correct and no changes since this date.  Cardiac Risk Factors include: advanced age (>49men, >23 women);dyslipidemia;obesity (BMI >30kg/m2)     Objective:    Today's Vitals   09/27/23 1500  Weight: 193 lb (87.5 kg)   Body mass index is 30.23 kg/m.     09/27/2023    3:06 PM 09/14/2022    3:34 PM 09/01/2021    2:28 PM  Advanced Directives  Does Patient Have a Medical Advance Directive? Yes Yes No  Type of Estate agent of [place];Living will Healthcare Power of [place];Living will   Does patient want to make changes to medical advance directive?   Yes (MAU/Ambulatory/Procedural Areas - Information given)  Copy of Healthcare Power of Attorney in Chart? No - copy requested No - copy requested     Current Medications (verified) Outpatient Encounter Medications as of 09/27/2023  Medication Sig   acyclovir (ZOVIRAX) 200 MG capsule TAKE 1 CAPSULE BY MOUTH FIVE TIMES DAILY   COLLAGEN PO Take by mouth.   Multiple Vitamin (MULTIVITAMIN) tablet Take 1 tablet by mouth daily.    Omega-3 1000 MG CAPS Take 2,000 mg by mouth every morning.    sertraline (ZOLOFT) 50 MG tablet TAKE 1 TABLET DAILY   simvastatin (ZOCOR) 40 MG tablet TAKE 1 TABLET DAILY OR AS DIRECTED   [DISCONTINUED] scopolamine (TRANSDERM-SCOP) 1 MG/3DAYS Place 1 patch (1.5 mg total) onto the skin every 3 (three) days.   No facility-administered encounter medications on file as of 09/27/2023.    Allergies (verified) Fentanyl and Zocor [simvastatin]   History: Past Medical History:  Diagnosis Date   Cataract    Chronic kidney disease    Depression    stress   Heart murmur    born with   Hyperlipidemia    Past Surgical History:  Procedure Laterality Date   COLONOSCOPY     COSMETIC SURGERY     facial   fibroid tumors     KNEE ARTHROSCOPY     TONSILLECTOMY AND ADENOIDECTOMY     as a child   WISDOM TOOTH EXTRACTION     Family History  Adopted: Yes  Problem Relation Age of Onset   Alcohol abuse Mother        Patient was adopted and not much else known   Colon cancer Neg Hx    Social History   Socioeconomic History   Marital status: Married    Spouse name: Not on file   Number of children: Not on file   Years of education: Not on file   Highest education level: Associate degree: academic program  Occupational History   Not on file  Tobacco Use  Smoking status: Never   Smokeless tobacco: Never  Substance and Sexual Activity   Alcohol use: Yes    Alcohol/week: 2.0 standard drinks of alcohol    Types: 2 Glasses of wine per week   Drug use: Yes    Comment: occasional marijuana perhaps once a month-goes to [place]   Sexual activity: Not on file  Other Topics Concern   Not on file  Social History Narrative   Married 2006 (together since 1984). No children.  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      Hobbies: gardener, gym, Human resources officer   Social Drivers of Health   Financial Resource Strain: Low Risk  (09/27/2023)   Overall Financial Resource Strain (CARDIA)    Difficulty of Paying Living Expenses: Not hard at all  Food Insecurity: No Food Insecurity (09/27/2023)   Hunger Vital Sign    Worried About Running Out of Food in the Last Year: Never true    Ran Out of Food in the Last Year: Never true  Transportation Needs: No Transportation Needs (09/27/2023)   PRAPARE - Administrator, Civil Service (Medical): No    Lack of Transportation (Non-Medical): No  Physical Activity: Sufficiently Active (09/27/2023)   Exercise Vital Sign    Days of Exercise per Week: 5 days    Minutes of Exercise per Session: 40 min  Stress: No Stress Concern Present (09/27/2023)   [employer] of Occupational Health - Occupational Stress Questionnaire    Feeling of Stress : Not at all  Social Connections: Moderately Isolated (09/27/2023)   Social Connection and Isolation Panel [NHANES]    Frequency of Communication with Friends and Family: More than three times a week    Frequency of Social Gatherings with Friends and Family: More than three times a week    Attends Religious Services: Never    Database administrator or Organizations: No    Attends Engineer, structural: Never    Marital Status: Married    Tobacco Counseling Counseling given: Not Answered    Clinical Intake:  Pre-visit preparation completed: Yes  Pain : No/denies pain     BMI - recorded: 30.23 Nutritional Status: BMI > 30  Obese Nutritional Risks: None Diabetes: No  How often do you need to have someone help you when you read instructions, pamphlets, or other written materials from your doctor or pharmacy?: 1 - Never  Interpreter Needed?: No  Information entered by :: Lanier Ensign, LPN   Activities of Daily Living      09/23/2023   11:58 AM  In your present state of health, do you have any difficulty performing the following activities:  Hearing? 0  Vision? 0  Difficulty concentrating or making decisions? 0  Walking or climbing stairs? 0  Dressing or bathing? 0  Doing errands, shopping? 0  Preparing Food and eating ? N  Using the Toilet? N  In the past six months, have you accidently leaked urine? N  Do you have problems with loss of bowel control? N  Managing your Medications? N  Managing your Finances? N  Housekeeping or managing your Housekeeping? N    Patient Care Team: Shelva Majestic,  MD as PCP - General (Family Medicine)  Indicate any recent Medical Services you may have received from other than [facility] providers in the past year (date may be approximate).     Assessment:   This is a routine wellness examination for Amber Jacobs.  Hearing/Vision screen Hearing Screening - Comments:: Pt denies any hearing issues  Vision Screening - Comments:: Pt follows up with Dr Shea Evans for annual eye exams    Goals Addressed             This Visit's Progress    Patient Stated       Maintain health and activity        Depression Screen     09/27/2023    3:04 PM 03/23/2023   10:52 AM 09/24/2022    9:19 AM 09/14/2022    3:33 PM 03/24/2022   10:00 AM 09/23/2021    9:11 AM 09/01/2021    2:27 PM  PHQ 2/9 Scores  PHQ - 2 Score 0 0 0 0 0 0 0  PHQ- 9 Score  3 0  0 0     Fall Risk     09/23/2023   11:58 AM 03/23/2023   10:51 AM 09/24/2022    9:18 AM 09/14/2022    3:35 PM 09/01/2021    2:29 PM  Fall Risk   Falls in the past year? 0 0 0 0 0  Number falls in past yr:  0 0 0 0  Injury with Fall?  0 0 0 0  Risk for fall due to : No Fall Risks No Fall Risks No Fall Risks Impaired vision Impaired vision  Follow up Falls prevention discussed Falls evaluation completed Falls evaluation completed Falls prevention discussed Falls prevention discussed    MEDICARE RISK AT HOME:  Medicare Risk at Home Any  stairs in or around the home?: (Patient-Rptd) Yes If so, are there any without handrails?: (Patient-Rptd) No Home free of loose throw rugs in walkways, pet beds, electrical cords, etc?: (Patient-Rptd) No Adequate lighting in your home to reduce risk of falls?: (Patient-Rptd) Yes Life alert?: (Patient-Rptd) No Use of a cane, walker or w/c?: (Patient-Rptd) No Grab bars in the bathroom?: (Patient-Rptd) Yes Shower chair or bench in shower?: (Patient-Rptd) Yes Elevated toilet seat or a handicapped toilet?: (Patient-Rptd) No  TIMED UP AND GO:  Was the test performed?  No  Cognitive Function: 6CIT completed        09/27/2023    3:12 PM 09/14/2022    3:40 PM 09/01/2021    2:31 PM  6CIT Screen  What Year? 0 points 0 points 0 points  What month? 0 points 0 points 0 points  What time? 0 points 0 points 0 points  Count back from 20 0 points 0 points 0 points  Months in reverse 0 points 0 points 0 points  Repeat phrase 0 points 0 points 0 points  Total Score 0 points 0 points 0 points    Immunizations Immunization History  Administered Date(s) Administered   Fluad Quad(high Dose 65+) 04/25/2020   Fluad Trivalent(High Dose 65+) 05/05/2023   Influenza Split 04/22/2012   Influenza, High Dose Seasonal PF 04/28/2021, 03/28/2022   Influenza,inj,Quad PF,6+ Mos 04/25/2013, 05/28/2014, 04/05/2015, 05/07/2016, 04/26/2017, 05/18/2018, 05/09/2019   Influenza-Unspecified 06/01/2014   PFIZER Comirnaty(Gray Top)Covid-19 Tri-Sucrose Vaccine 08/05/2022   PFIZER(Purple Top)SARS-COV-2 Vaccination 10/20/2019, 11/14/2019, 03/25/2020, 11/14/2020, 04/11/2021   PNEUMOCOCCAL CONJUGATE-20 09/23/2021   Pfizer Covid-19 Vaccine Bivalent Booster 90yrs & up 03/14/2022   Pfizer(Comirnaty)Fall Seasonal Vaccine 12 years and older  03/31/2023   Pneumococcal Polysaccharide-23 07/10/2020   Td 08/04/2003   Tdap 08/07/2014   Zoster Recombinant(Shingrix) 04/12/2019, 06/17/2019   Zoster, Live 06/17/2019    Screening  Tests Health Maintenance  Topic Date Due   Zoster Vaccines- Shingrix (2 of 2) 08/12/2019   Hepatitis C Screening  07/31/2098 (Originally 10/22/1973)   DTaP/Tdap/Td (3 - Td or Tdap) 08/07/2024   Medicare Annual Wellness (AWV)  09/26/2024   MAMMOGRAM  08/09/2025   Colonoscopy  07/03/2026   Pneumonia Vaccine 71+ Years old  Completed   INFLUENZA VACCINE  Completed   DEXA SCAN  Completed   COVID-19 Vaccine  Completed   HPV VACCINES  Aged Out    Health Maintenance  Health Maintenance Due  Topic Date Due   Zoster Vaccines- Shingrix (2 of 2) 08/12/2019   Health Maintenance Items Addressed: Pt stated shingrix completed it still shows over due  in health maintenance   Additional Screening:  Vision Screening: Recommended annual ophthalmology exams for early detection of glaucoma and other disorders of the eye.  Dental Screening: Recommended annual dental exams for proper oral hygiene  Community Resource Referral / Chronic Care Management: CRR required this visit?  No   CCM required this visit?  No     Plan:     I have personally reviewed and noted the following in the patient's chart:   Medical and social history Use of alcohol, tobacco or illicit drugs  Current medications and supplements including opioid prescriptions. Patient is not currently taking opioid prescriptions. Functional ability and status Nutritional status Physical activity Advanced directives List of other physicians Hospitalizations, surgeries, and ER visits in previous 12 months Vitals Screenings to include cognitive, depression, and falls Referrals and appointments  In addition, I have reviewed and discussed with patient certain preventive protocols, quality metrics, and best practice recommendations. A written personalized care plan for preventive services as well as general preventive health recommendations were provided to patient.     Marzella Schlein, LPN   [id number]   After Visit Summary:  (MyChart) Due to this being a telephonic visit, the after visit summary with patients personalized plan was offered to patient via MyChart   Notes: Nothing significant to report at this time.

## 2023-09-27 NOTE — Progress Notes (Unsigned)
    [facility] [address] [place] [address] Phone: [phone]   Assessment and Plan:     There are no diagnoses linked to this encounter.  ***   Pertinent previous records reviewed include ***    Follow Up: ***     Subjective:   I, Amber Jacobs, am serving as a Neurosurgeon for Doctor Amber Jacobs   Chief Complaint: left buttock pain    HPI:    10/24/21 Patient is a 68 year old female complaining of left buttock pain. Patient states pain had been ongoing for 8 weeks at least. At last visit felt like was recovering and declined further workup. Typical trigger has been cleaning attic- usually occurs every 2 years. Not even able to do her water yoga No pain walking. Cannot lay down due to intensity of pain. Pain 10/10- describes as "100". Has to pull leg up and strap to be able to even sleep. Pain remains in the left buttocks- nothing going down the leg at this time.  Has tried inversion table. Has been to [facility] and tried some exercises. Feels like almost needs to get "pulled out" and has literally tried that with neighbor with mild success for brief period. Years ago PT did help with traction. Heat and ice minimal help. would like to discuss OMT    08/04/2022 Patient states that she is okay needs an adjustment   09/28/2023 Patient states   Relevant Historical Information: CKD stage III  Additional pertinent review of systems negative.   Current Outpatient Medications:    acyclovir (ZOVIRAX) 200 MG capsule, TAKE 1 CAPSULE BY MOUTH FIVE TIMES DAILY, Disp: 25 capsule, Rfl: 3   COLLAGEN PO, Take by mouth., Disp: , Rfl:    Multiple Vitamin (MULTIVITAMIN) tablet, Take 1 tablet by mouth daily., Disp: , Rfl:    Omega-3 1000 MG CAPS, Take 2,000 mg by mouth every morning. , Disp: , Rfl:    scopolamine (TRANSDERM-SCOP) 1 MG/3DAYS, Place 1 patch (1.5 mg total) onto the skin every 3 (three) days., Disp: 4 patch, Rfl: 0    sertraline (ZOLOFT) 50 MG tablet, TAKE 1 TABLET DAILY, Disp: 90 tablet, Rfl: 3   simvastatin (ZOCOR) 40 MG tablet, TAKE 1 TABLET DAILY OR AS DIRECTED, Disp: 90 tablet, Rfl: 3   Objective:     There were no vitals filed for this visit.    There is no height or weight on file to calculate BMI.    Physical Exam:    ***   Electronically signed by:  [facility] 12:25 PM 09/27/23

## 2023-09-27 NOTE — Patient Instructions (Signed)
 Amber Jacobs , Thank you for taking time to come for your Medicare Wellness Visit. I appreciate your ongoing commitment to your health goals. Please review the following plan we discussed and let me know if I can assist you in the future.   Referrals/Orders/Follow-Ups/Clinician Recommendations: maintain health and activity   This is a list of the screening recommended for you and due dates:  Health Maintenance  Topic Date Due   Zoster (Shingles) Vaccine (2 of 2) 08/12/2019   Hepatitis C Screening  07/31/2098*   DTaP/Tdap/Td vaccine (3 - Td or Tdap) 08/07/2024   Medicare Annual Wellness Visit  09/26/2024   Mammogram  08/09/2025   Colon Cancer Screening  07/03/2026   Pneumonia Vaccine  Completed   Flu Shot  Completed   DEXA scan (bone density measurement)  Completed   COVID-19 Vaccine  Completed   HPV Vaccine  Aged Out  *Topic was postponed. The date shown is not the original due date.    Advanced directives: (Copy Requested) Please bring a copy of your health care power of attorney and living will to the office to be added to your chart at your convenience.  Next Medicare Annual Wellness Visit scheduled for next year: Yes

## 2023-09-28 ENCOUNTER — Ambulatory Visit (INDEPENDENT_AMBULATORY_CARE_PROVIDER_SITE_OTHER): Payer: TRICARE For Life (TFL) | Admitting: Sports Medicine

## 2023-09-28 ENCOUNTER — Ambulatory Visit (INDEPENDENT_AMBULATORY_CARE_PROVIDER_SITE_OTHER): Payer: TRICARE For Life (TFL)

## 2023-09-28 ENCOUNTER — Ambulatory Visit: Payer: Medicare PPO | Admitting: Sports Medicine

## 2023-09-28 VITALS — BP 110/80 | HR 82 | Ht 67.0 in | Wt 194.0 lb

## 2023-09-28 DIAGNOSIS — M175 Other unilateral secondary osteoarthritis of knee: Secondary | ICD-10-CM | POA: Diagnosis not present

## 2023-09-28 DIAGNOSIS — M25562 Pain in left knee: Secondary | ICD-10-CM

## 2023-09-28 DIAGNOSIS — M1712 Unilateral primary osteoarthritis, left knee: Secondary | ICD-10-CM | POA: Diagnosis not present

## 2023-09-28 MED ORDER — MELOXICAM 15 MG PO TABS: 15.0000 mg | ORAL_TABLET | Freq: Every day | ORAL | 0 refills | Status: DC

## 2023-09-28 NOTE — Patient Instructions (Signed)
-   Start meloxicam 15 mg daily x2 weeks.  If still having pain after 2 weeks, complete 3rd-week of NSAID. May use remaining NSAID as needed once daily for pain control.  Do not to use additional over-the-counter NSAIDs (ibuprofen, naproxen, Advil, Aleve) while taking prescription NSAIDs.  May use Tylenol [phone] mg 2 to 3 times a day for breakthrough pain. Knee HEP  Compression sleeve  Avoid deep flexion exercises like stair master for 2 weeks  4 week follow up

## 2023-10-06 ENCOUNTER — Ambulatory Visit (INDEPENDENT_AMBULATORY_CARE_PROVIDER_SITE_OTHER): Payer: TRICARE For Life (TFL) | Admitting: Family Medicine

## 2023-10-06 ENCOUNTER — Encounter: Payer: Self-pay | Admitting: Family Medicine

## 2023-10-06 VITALS — BP 108/60 | HR 72 | Temp 97.9°F | Ht 67.0 in | Wt 187.8 lb

## 2023-10-06 DIAGNOSIS — N1831 Chronic kidney disease, stage 3a: Secondary | ICD-10-CM | POA: Diagnosis not present

## 2023-10-06 DIAGNOSIS — R739 Hyperglycemia, unspecified: Secondary | ICD-10-CM | POA: Diagnosis not present

## 2023-10-06 DIAGNOSIS — Z131 Encounter for screening for diabetes mellitus: Secondary | ICD-10-CM

## 2023-10-06 DIAGNOSIS — E785 Hyperlipidemia, unspecified: Secondary | ICD-10-CM | POA: Diagnosis not present

## 2023-10-06 LAB — CBC WITH DIFFERENTIAL/PLATELET
Basophils Absolute: 0 10*3/uL (ref 0.0–0.1)
Basophils Relative: 0.4 % (ref 0.0–3.0)
Eosinophils Absolute: 0.1 10*3/uL (ref 0.0–0.7)
Eosinophils Relative: 1.9 % (ref 0.0–5.0)
HCT: 40 % (ref 36.0–46.0)
Hemoglobin: 13.6 g/dL (ref 12.0–15.0)
Lymphocytes Relative: 40.4 % (ref 12.0–46.0)
Lymphs Abs: 1.6 10*3/uL (ref 0.7–4.0)
MCHC: 33.9 g/dL (ref 30.0–36.0)
MCV: 89.8 fl (ref 78.0–100.0)
Monocytes Absolute: 0.3 10*3/uL (ref 0.1–1.0)
Monocytes Relative: 6.5 % (ref 3.0–12.0)
Neutro Abs: 2 10*3/uL (ref 1.4–7.7)
Neutrophils Relative %: 50.8 % (ref 43.0–77.0)
Platelets: 231 10*3/uL (ref 150.0–400.0)
RBC: 4.46 Mil/uL (ref 3.87–5.11)
RDW: 13 % (ref 11.5–15.5)
WBC: 3.9 10*3/uL — ABNORMAL LOW (ref 4.0–10.5)

## 2023-10-06 LAB — LIPID PANEL
Cholesterol: 157 mg/dL (ref 0–200)
HDL: 41.3 mg/dL (ref >39.00)
LDL Cholesterol: 83 mg/dL (ref 0–99)
NonHDL: 115.2
Total CHOL/HDL Ratio: 4
Triglycerides: 160 mg/dL — ABNORMAL HIGH (ref 0.0–149.0)
VLDL: 32 mg/dL (ref 0.0–40.0)

## 2023-10-06 LAB — HEMOGLOBIN A1C: Hgb A1c MFr Bld: 6.1 % (ref 4.6–6.5)

## 2023-10-06 LAB — COMPREHENSIVE METABOLIC PANEL
ALT: 23 U/L (ref 0–35)
AST: 22 U/L (ref 0–37)
Albumin: 4.6 g/dL (ref 3.5–5.2)
Alkaline Phosphatase: 62 U/L (ref 39–117)
BUN: 17 mg/dL (ref 6–23)
CO2: 28 meq/L (ref 19–32)
Calcium: 9.5 mg/dL (ref 8.4–10.5)
Chloride: 106 meq/L (ref 96–112)
Creatinine, Ser: 1 mg/dL (ref 0.40–1.20)
GFR: 57.71 mL/min — ABNORMAL LOW (ref >60.00)
Glucose, Bld: 108 mg/dL — ABNORMAL HIGH (ref 70–99)
Potassium: 4.4 meq/L (ref 3.5–5.1)
Sodium: 140 meq/L (ref 135–145)
Total Bilirubin: 0.5 mg/dL (ref 0.2–1.2)
Total Protein: 7.3 g/dL (ref 6.0–8.3)

## 2023-10-06 MED ORDER — BENZONATATE 100 MG PO CAPS: 100.0000 mg | ORAL_CAPSULE | Freq: Three times a day (TID) | ORAL | 0 refills | Status: DC | PRN

## 2023-10-06 NOTE — Progress Notes (Signed)
 Phone [phone] In person visit   Subjective:   Amber Jacobs is a 68 y.o. year old very pleasant female patient who presents for/with See problem oriented charting Chief Complaint  Patient presents with   Medical Management of Chronic Issues   Hyperlipidemia   Past Medical History-  Patient Active Problem List   Diagnosis Date Noted   Alopecia areata 10/27/2018    Priority: Medium    Asthma 10/27/2018    Priority: Medium    Genital herpes 05/07/2016    Priority: Medium    CKD (chronic kidney disease), stage III (HCC) 08/07/2014    Priority: Medium    Hyperglycemia 08/07/2014    Priority: Medium    Hyperlipidemia 04/02/2008    Priority: Medium    Tendon rupture, nontraumatic, extensor 03/21/2015    Priority: Low   Hormone replacement therapy 11/01/2014    Priority: Low   Stress at work 08/07/2014    Priority: Low   Injury of toenail 06/13/2014    Priority: Low   Hematuria 05/02/2010    Priority: Low   Strain of left pectoralis muscle 09/20/2015   Chest pain 09/20/2015    Medications- reviewed and updated Current Outpatient Medications  Medication Sig Dispense Refill   acyclovir (ZOVIRAX) 200 MG capsule TAKE 1 CAPSULE BY MOUTH FIVE TIMES DAILY 25 capsule 3   benzonatate (TESSALON) 100 MG capsule Take 1-2 capsules (100-200 mg total) by mouth 3 (three) times daily as needed for cough. 60 capsule 0   COLLAGEN PO Take by mouth.     meloxicam (MOBIC) 15 MG tablet Take 1 tablet (15 mg total) by mouth daily. 30 tablet 0   Multiple Vitamin (MULTIVITAMIN) tablet Take 1 tablet by mouth daily.     Omega-3 1000 MG CAPS Take 2,000 mg by mouth every morning.      sertraline (ZOLOFT) 50 MG tablet TAKE 1 TABLET DAILY 90 tablet 3   simvastatin (ZOCOR) 40 MG tablet TAKE 1 TABLET DAILY OR AS DIRECTED 90 tablet 3   No current facility-administered medications for this visit.     Objective:  BP 108/60   Pulse 72   Temp 97.9 F (36.6 C)   Ht 5\' 7"  (1.702 m)   Wt 187  lb 12.8 oz (85.2 kg)   SpO2 95%   BMI 29.41 kg/m  Gen: NAD, resting comfortably No sinus tenderness. Tympanic membrane normal bilaterally, nasal turbinates mild erythema and some discharge on the right side CV: RRR no murmurs rubs or gallops Lungs: CTAB no crackles, wheeze, rhonchi Abdomen: soft/nontender/nondistended/normal bowel sounds. No rebound or guarding.  Ext: no edema Skin: warm, dry Neuro: grossly normal, moves all extremities Left knee- some pain along lateral head of hamstring- no effusion. Possible mild bakers cyst    Assessment and Plan   # Cough S:reports prior cough in September resolved. Thinks started mid February with current cough- had bene at [facility] event- a lot of exposures.had some old tessalon Perles- and helpful until ran out.  No shortness of breath. No fevers. Bad cold first 2 days and then lingering cough. Some nasal congestion. Some chest congestion. Improving some. So bad threw her back out.  A/P: cough improving- likely upper respiratory infection (URI) - sinuses do not appear involved. Lungs clear after coughing- she wants to continue the tessalon which we refilled and follow up if fails to improve in next 2-4 weeks or sooner if worsens and we can image   #hyperlipidemia S: Medication:Simvastatin 40 mg-has produced over 30% reduction  from peak LDL - We have been cautious about increasing further due to prediabetes risk  - exercise down due to knee other than upper body Lab Results  Component Value Date   CHOL 175 09/29/2022   HDL 43.30 09/29/2022   LDLCALC 101 (H) 09/29/2022   LDLDIRECT 124.0 03/23/2023   TRIG 156.0 (H) 09/29/2022   CHOLHDL 4 09/29/2022  A/P: hopefully stable or improved- update lipid panel today. Continue current meds for now   # Chronic kidney disease stage III S: GFR typically in the 50s but can oscillate into the 60s - Knows to avoid NSAIDs in general but short term mobic right now  A/P: hopefully stable- update cmp  today. Continue without meds for now     # Hyperglycemia/insulin resistance/prediabetes-A1c 6.0 August 2023 S:  Medication: None Exercise and diet- see above Lab Results  Component Value Date   HGBA1C 5.9 03/23/2023   HGBA1C 5.9 09/29/2022   HGBA1C 6.0 03/24/2022  A/P: hopefully stable- update a1c today. Continue without meds for now   #Left knee- working with Dr. Jean Rosenthal. On meloxicam in last week- not sure helping. Doing home exercises.   #situational stress- started on sertraline 50mg  with work- has wanted to continue - had work related stress previously- now more related to world events    09/27/2023    3:04 PM 03/23/2023   10:52 AM 09/24/2022    9:19 AM  Depression screen PHQ 2/9  Decreased Interest 0 0 0  Down, Depressed, Hopeless 0 0 0  PHQ - 2 Score 0 0 0  Altered sleeping  0 0  Tired, decreased energy  0 0  Change in appetite  3 0  Feeling bad or failure about yourself   0 0  Trouble concentrating  0 0  Moving slowly or fidgety/restless  0 0  Suicidal thoughts  0 0  PHQ-9 Score  3 0  Difficult doing work/chores  Not difficult at all Not difficult at all   Recommended follow up: Return in about 6 months (around 04/07/2024) for physical or sooner if needed.Schedule b4 you leave. Future Appointments  Date Time Provider Department Center  10/26/2023  9:30 AM Richardean Sale, DO [facility] None   Lab/Order associations:   ICD-10-CM   1. Hyperlipidemia, unspecified hyperlipidemia type  E78.5 Lipid panel    CBC with Differential/Platelet    Comprehensive metabolic panel    2. Stage 3a chronic kidney disease (HCC)  N18.31     3. Hyperglycemia  R73.9 Hemoglobin A1c    4. Screening for diabetes mellitus  Z13.1 Hemoglobin A1c      Meds ordered this encounter  Medications   benzonatate (TESSALON) 100 MG capsule    Sig: Take 1-2 capsules (100-200 mg total) by mouth 3 (three) times daily as needed for cough.    Dispense:  60 capsule    Refill:  0    Return precautions  advised.  Tana Conch, MD

## 2023-10-06 NOTE — Patient Instructions (Addendum)
 Please stop by lab before you go If you have mychart- we will send your results within 3 business days of [place] receiving them.  If you do not have mychart- we will call you about results within 5 business days of [place] receiving them.  *please also note that you will see labs on mychart as soon as they post. I will later go in and write notes on them- will say "notes from Dr. Durene Cal"   Recommended follow up: Return in about 6 months (around 04/07/2024) for physical or sooner if needed.Schedule b4 you leave.

## 2023-10-06 NOTE — Addendum Note (Signed)
 Addended by: Shelva Majestic on: 10/06/2023 12:11 PM   Modules accepted: Level of Service

## 2023-10-07 ENCOUNTER — Encounter: Payer: Self-pay | Admitting: Family Medicine

## 2023-10-11 ENCOUNTER — Encounter: Payer: Self-pay | Admitting: Family Medicine

## 2023-10-18 ENCOUNTER — Encounter: Payer: Self-pay | Admitting: Sports Medicine

## 2023-10-18 NOTE — Telephone Encounter (Signed)
 Called and advised patient to keep her appointment

## 2023-10-25 ENCOUNTER — Other Ambulatory Visit: Payer: Self-pay | Admitting: Sports Medicine

## 2023-10-25 NOTE — Progress Notes (Unsigned)
    [facility] [address] [place] [address] Phone: [phone]   Assessment and Plan:     There are no diagnoses linked to this encounter.  ***   Pertinent previous records reviewed include ***    Follow Up: ***     Subjective:   I, Amber Jacobs, am serving as a Neurosurgeon for Doctor Richardean Sale   Chief Complaint: posterior knee pain    HPI:      09/28/2023 Patient states posterior knee pain. Patient states [place] the airport with [place] on about a month ago. RICES, pain just isn't getting better. No radiating pain . Hx of ACL surgery with cadaver graft. No numbness and tingling. Mostly weakness and pain.   10/26/2023 Patient states   Relevant Historical Information: CKD stage III Additional pertinent review of systems negative.   Current Outpatient Medications:    acyclovir (ZOVIRAX) 200 MG capsule, TAKE 1 CAPSULE BY MOUTH FIVE TIMES DAILY, Disp: 25 capsule, Rfl: 3   benzonatate (TESSALON) 100 MG capsule, Take 1-2 capsules (100-200 mg total) by mouth 3 (three) times daily as needed for cough., Disp: 60 capsule, Rfl: 0   COLLAGEN PO, Take by mouth., Disp: , Rfl:    meloxicam (MOBIC) 15 MG tablet, Take 1 tablet (15 mg total) by mouth daily., Disp: 30 tablet, Rfl: 0   Multiple Vitamin (MULTIVITAMIN) tablet, Take 1 tablet by mouth daily., Disp: , Rfl:    Omega-3 1000 MG CAPS, Take 2,000 mg by mouth every morning. , Disp: , Rfl:    sertraline (ZOLOFT) 50 MG tablet, TAKE 1 TABLET DAILY, Disp: 90 tablet, Rfl: 3   simvastatin (ZOCOR) 40 MG tablet, TAKE 1 TABLET DAILY OR AS DIRECTED, Disp: 90 tablet, Rfl: 3   Objective:     There were no vitals filed for this visit.    There is no height or weight on file to calculate BMI.    Physical Exam:    ***   Electronically signed by:  Amber [facility] 12:52 PM 10/25/23

## 2023-10-26 ENCOUNTER — Ambulatory Visit (INDEPENDENT_AMBULATORY_CARE_PROVIDER_SITE_OTHER): Payer: TRICARE For Life (TFL) | Admitting: Sports Medicine

## 2023-10-26 VITALS — BP 130/84 | HR 80 | Ht 67.0 in | Wt 187.0 lb

## 2023-10-26 DIAGNOSIS — G8929 Other chronic pain: Secondary | ICD-10-CM | POA: Diagnosis not present

## 2023-10-26 DIAGNOSIS — M25562 Pain in left knee: Secondary | ICD-10-CM | POA: Diagnosis not present

## 2023-10-26 DIAGNOSIS — M175 Other unilateral secondary osteoarthritis of knee: Secondary | ICD-10-CM

## 2023-10-26 NOTE — Patient Instructions (Signed)
 Refer to [facility] PT Follow up in 2 to 3 weeks.

## 2023-11-11 NOTE — Progress Notes (Unsigned)
    Amber Jacobs D[name] [address] [place] [address] Phone: [phone]   Assessment and Plan:     There are no diagnoses linked to this encounter.  ***   Pertinent previous records reviewed include ***    Follow Up: ***     Subjective:   I, Amber Jacobs, am serving as a Neurosurgeon for Amber Jacobs   Chief Complaint: posterior knee pain    HPI:      09/28/2023 Patient states posterior knee pain. Patient states she was running [place] the [place] with [place] on about a month ago. RICES, pain just isn't getting better. No radiating pain . Hx of ACL surgery with cadaver graft. No numbness and tingling. Mostly weakness and pain.    10/26/2023 Patient states that she is doing the same. Sometimes the pain is worse (intermittent )  11/12/2023 Patient states  Relevant Historical Information: CKD stage III  Additional pertinent review of systems negative.   Current Outpatient Medications:    acyclovir (ZOVIRAX) 200 MG capsule, TAKE 1 CAPSULE BY MOUTH FIVE TIMES DAILY, Disp: 25 capsule, Rfl: 3   benzonatate (TESSALON) 100 MG capsule, Take 1-2 capsules (100-200 mg total) by mouth 3 (three) times daily as needed for cough., Disp: 60 capsule, Rfl: 0   COLLAGEN PO, Take by mouth., Disp: , Rfl:    meloxicam (MOBIC) 15 MG tablet, Take 1 tablet (15 mg total) by mouth daily., Disp: 30 tablet, Rfl: 0   Multiple Vitamin (MULTIVITAMIN) tablet, Take 1 tablet by mouth daily., Disp: , Rfl:    Omega-3 1000 MG CAPS, Take 2,000 mg by mouth every morning. , Disp: , Rfl:    sertraline (ZOLOFT) 50 MG tablet, TAKE 1 TABLET DAILY, Disp: 90 tablet, Rfl: 3   simvastatin (ZOCOR) 40 MG tablet, TAKE 1 TABLET DAILY OR AS DIRECTED, Disp: 90 tablet, Rfl: 3   Objective:     There were no vitals filed for this visit.    There is no height or weight on file to calculate BMI.    Physical Exam:    ***   Electronically signed by:  Amber Jacobs [name] 7:34 AM 11/11/23

## 2023-11-12 ENCOUNTER — Ambulatory Visit: Admitting: Sports Medicine

## 2023-11-12 VITALS — BP 108/76 | HR 71 | Ht 67.0 in

## 2023-11-12 DIAGNOSIS — M175 Other unilateral secondary osteoarthritis of knee: Secondary | ICD-10-CM

## 2023-11-12 DIAGNOSIS — M25562 Pain in left knee: Secondary | ICD-10-CM | POA: Diagnosis not present

## 2023-11-12 DIAGNOSIS — G8929 Other chronic pain: Secondary | ICD-10-CM

## 2023-12-07 ENCOUNTER — Encounter (HOSPITAL_BASED_OUTPATIENT_CLINIC_OR_DEPARTMENT_OTHER): Payer: Self-pay | Admitting: Physical Therapy

## 2023-12-07 ENCOUNTER — Ambulatory Visit (HOSPITAL_BASED_OUTPATIENT_CLINIC_OR_DEPARTMENT_OTHER): Attending: Sports Medicine | Admitting: Physical Therapy

## 2023-12-07 DIAGNOSIS — M6281 Muscle weakness (generalized): Secondary | ICD-10-CM | POA: Diagnosis not present

## 2023-12-07 DIAGNOSIS — M25562 Pain in left knee: Secondary | ICD-10-CM | POA: Insufficient documentation

## 2023-12-07 DIAGNOSIS — G8929 Other chronic pain: Secondary | ICD-10-CM | POA: Insufficient documentation

## 2023-12-07 DIAGNOSIS — M175 Other unilateral secondary osteoarthritis of knee: Secondary | ICD-10-CM | POA: Insufficient documentation

## 2023-12-07 NOTE — Therapy (Signed)
 OUTPATIENT PHYSICAL THERAPY LOWER EXTREMITY EVALUATION   Patient Name: Amber Jacobs MRN: [id number] DOB:1956/07/29, 68 y.o., female Today's Date: 12/07/2023  END OF SESSION:  PT End of Session - 12/07/23 1349     Visit Number 1    Number of Visits 8    PT Start Time 1016    PT Stop Time 1058    PT Time Calculation (min) 42 min    Activity Tolerance Patient tolerated treatment well;No increased pain    Behavior During Therapy WFL for tasks assessed/performed             Past Medical History:  Diagnosis Date   Cataract    Chronic kidney disease    Depression    stress   Heart murmur    born with   Hyperlipidemia    Past Surgical History:  Procedure Laterality Date   COLONOSCOPY     COSMETIC SURGERY     facial   fibroid tumors     KNEE ARTHROSCOPY     TONSILLECTOMY AND ADENOIDECTOMY     as a child   WISDOM TOOTH EXTRACTION     Patient Active Problem List   Diagnosis Date Noted   Alopecia areata 10/27/2018   Asthma 10/27/2018   Genital herpes 05/07/2016   Strain of left pectoralis muscle 09/20/2015   Chest pain 09/20/2015   Tendon rupture, nontraumatic, extensor 03/21/2015   Hormone replacement therapy 11/01/2014   Stress at work 08/07/2014   CKD (chronic kidney disease), stage III (HCC) 08/07/2014   Hyperglycemia 08/07/2014   Injury of toenail 06/13/2014   Hematuria 05/02/2010   Hyperlipidemia 04/02/2008    PCP: Almira Jaeger, MD  REFERRING PROVIDER: Ulysees Gander, DO  REFERRING DIAG: L knee OA  THERAPY DIAG:  L knee OA Muscle weakness  Rationale for Evaluation and Treatment: Rehabilitation  ONSET DATE: 1 month ago  SUBJECTIVE:   SUBJECTIVE STATEMENT: Eval: Patient had an acute onset of left knee pain several months ago. She had a previous AL surgery 10 years prior.Pt had a steroid injection a month ago and has had no pain since. Still feels like something is off. Wants to get gym program developed and get stronger before her  MRI.  Has pain in the back of the knee. Feels it the most when she tries to push down on a shovel. Does not have issues with balance on a daily basis. Her x-ray shows minimal OA in her knee.   PERTINENT HISTORY: ACL tendon rupture PAIN:  Are you having pain? No  PRECAUTIONS: None  RED FLAGS: None   WEIGHT BEARING RESTRICTIONS: No  FALLS:  Has patient fallen in last 6 months? No  LIVING ENVIRONMENT: Stairs (no problems)  OCCUPATION: Usher at [employer]  PLOF: Independent  PATIENT GOALS: Get Film/video editor for daily activity  NEXT MD VISIT: none  OBJECTIVE:  Note: Objective measures were completed at Evaluation unless otherwise noted.  DIAGNOSTIC FINDINGS:  Sequela prior ACL repair. Mild medial and lateral tibiofemoral joint space narrowing. Mild patellofemoral spurring. No significant knee joint effusion. No erosion or focal bone abnormality. Unremarkable soft tissues.  PATIENT SURVEYS:  LEFS 73/80  COGNITION: Overall cognitive status: Within functional limits for tasks assessed     SENSATION: WFL  EDEMA:    MUSCLE LENGTH:   POSTURE: rounded shoulders and forward head  PALPATION: TTP hamstrings insertion  LOWER EXTREMITY ROM:  Passive ROM Right eval Left eval  Hip flexion    Hip extension    Hip  abduction    Hip adduction    Hip internal rotation    Hip external rotation    Knee flexion    Knee extension  -2 No knee hyper extension   Ankle dorsiflexion    Ankle plantarflexion    Ankle inversion    Ankle eversion     (Blank rows = not tested)  LOWER EXTREMITY MMT:  MMT Right eval Left eval  Hip flexion 39.1 39.2   Hip extension    Hip abduction 49.2 63.3  Hip adduction    Hip internal rotation    Hip external rotation    Knee flexion    Knee extension 63 55.8  Ankle dorsiflexion    Ankle plantarflexion    Ankle inversion    Ankle eversion     (Blank rows = not tested)  LOWER EXTREMITY SPECIAL TESTS:    FUNCTIONAL  TESTS:  SLS: decreased stance, unable to complete due to balance  Limited patella motion   GAIT:    Treatment Date: 5/6  There-ex: Leg press 2x10 90lbs Leg extension 2x10 25lbs HEP education TKE red RTB 3x10 each leg Seated HS stretch 1x30sec     PATIENT EDUCATION:  Education details: HEP, symptom management, self care Person educated: Patient Education method: Explanation, Demonstration, Tactile cues, Verbal cues, and Handouts Education comprehension: verbalized understanding and returned demonstration  HOME EXERCISE PROGRAM: Access Code: [facility] URL: [email or web address] Date: 12/07/2023 Prepared by: Lonzell Robin  Exercises - Long Sitting 4 Way Patellar Glide  - 1 x daily - 7 x weekly - 3 sets - 10 reps - Seated Hamstring Stretch  - 1 x daily - 7 x weekly - 3 sets - 10 reps - Standing Terminal Knee Extension with Resistance  - 1 x daily - 7 x weekly - 3 sets - 10 reps - Single Leg Knee Extension with Weight Machine  - 1 x daily - 7 x weekly - 3 sets - 10 reps  ASSESSMENT:  CLINICAL IMPRESSION: Patient is a 68 y.o. female who was seen today for physical therapy evaluation and treatment for left knee OA. Imaging notes are listed in objective section. Minimal OA is noted. Objective measures were taken. SLS was area for improvement. Next session can focus on balance.  Pt chief request is to increase strength before MRI. She is active in the gym and wants guidance on form, mechanics, and muscle activation. Ask next visit when MRI is scheduled (if scheduled). Pt was educated on exercise prescription, grading, and progressions. Pt tolerated all exercises well. She is active, working out at this gym 3x per week. Pt will continue to benefit from skilled physical therapy to increase functional strength for ADL's.   OBJECTIVE IMPAIRMENTS: decreased balance and decreased strength.   ACTIVITY LIMITATIONS: carrying, standing, and  squatting  PARTICIPATION LIMITATIONS: shopping and community activity  PERSONAL FACTORS: 1-2 comorbidities: ACL surgery, depression  are also affecting patient's functional outcome.   REHAB POTENTIAL: Good  CLINICAL DECISION MAKING: Stable/uncomplicated  EVALUATION COMPLEXITY: Low   GOALS: Goals reviewed with patient? Yes  SHORT TERM GOALS: Target date: 01/04/2024   Pt will increase knee extension strength by 5lbs for community ambulation confidence.  Baseline: Goal status: INITIAL  2.  Pt will have increased patellar mobility for proper quad tracking. Baseline:  Goal status: INITIAL  3.  Pt will be able to complete SLS with moderate swaying for functional balance.  Baseline:  Goal status: INITIAL  4.  Pt will be independent with HEP.  Baseline:  Goal status: INITIAL    LONG TERM GOALS: Target date: 02/01/2024   Pt will increase knee extension strength by 10lbs for community ambulation confidence.  Baseline:  Goal status: INITIAL  2.  Pt will ambulate community distances and complete yard work without increases in symptoms for confidence in ADL's.   Baseline:  Goal status: INITIAL  3.  Pt will be able to complete SLS with minimal swaying for ambulation balance. Baseline:  Goal status: INITIAL     PLAN:  PT FREQUENCY: 1x/month  PT DURATION: 8 weeks  PLANNED INTERVENTIONS: 91478- PT Re-evaluation, 97750- Physical Performance Testing, 97110-Therapeutic exercises, 97530- Therapeutic activity, [id number]- Neuromuscular re-education, 97535- Self Care, 29562- Manual therapy, [id number]- Gait training, [phone]- Aquatic Therapy, [phone]- Electrical stimulation (unattended), and 97032- Electrical stimulation (manual)  PLAN FOR NEXT SESSION: Expand gym program, work on patellar mobility. Continue to grade gym program. Consider working on instability exercises on air-ex  Manual therapy to achieve terminal knee extension    Katheran Palms, Student-PT 12/07/2023, 1:50 PM  I have  reviewed and concur with this student's documentation.   Kitty Perkins, PT 12/08/2023 10:36 AM   During this treatment session, the therapist was present, participating in and directing the treatment.

## 2023-12-08 ENCOUNTER — Encounter (HOSPITAL_BASED_OUTPATIENT_CLINIC_OR_DEPARTMENT_OTHER): Payer: Self-pay | Admitting: Physical Therapy

## 2024-01-05 ENCOUNTER — Encounter (HOSPITAL_BASED_OUTPATIENT_CLINIC_OR_DEPARTMENT_OTHER): Admitting: Physical Therapy

## 2024-02-28 ENCOUNTER — Other Ambulatory Visit: Payer: Self-pay | Admitting: Family Medicine

## 2024-04-17 ENCOUNTER — Encounter: Admitting: Family Medicine

## 2024-05-25 ENCOUNTER — Ambulatory Visit: Admitting: Family Medicine

## 2024-05-25 ENCOUNTER — Encounter: Payer: Self-pay | Admitting: Family Medicine

## 2024-05-25 ENCOUNTER — Ambulatory Visit: Payer: Self-pay | Admitting: Family Medicine

## 2024-05-25 VITALS — BP 110/62 | HR 60 | Temp 97.5°F | Ht 67.0 in | Wt 194.8 lb

## 2024-05-25 DIAGNOSIS — R739 Hyperglycemia, unspecified: Secondary | ICD-10-CM | POA: Diagnosis not present

## 2024-05-25 DIAGNOSIS — Z131 Encounter for screening for diabetes mellitus: Secondary | ICD-10-CM

## 2024-05-25 DIAGNOSIS — Z0000 Encounter for general adult medical examination without abnormal findings: Secondary | ICD-10-CM

## 2024-05-25 DIAGNOSIS — N1831 Chronic kidney disease, stage 3a: Secondary | ICD-10-CM

## 2024-05-25 DIAGNOSIS — E785 Hyperlipidemia, unspecified: Secondary | ICD-10-CM

## 2024-05-25 DIAGNOSIS — Z1283 Encounter for screening for malignant neoplasm of skin: Secondary | ICD-10-CM | POA: Diagnosis not present

## 2024-05-25 LAB — COMPREHENSIVE METABOLIC PANEL WITH GFR
ALT: 17 U/L (ref 0–35)
AST: 19 U/L (ref 0–37)
Albumin: 4.6 g/dL (ref 3.5–5.2)
Alkaline Phosphatase: 54 U/L (ref 39–117)
BUN: 17 mg/dL (ref 6–23)
CO2: 27 meq/L (ref 19–32)
Calcium: 9 mg/dL (ref 8.4–10.5)
Chloride: 106 meq/L (ref 96–112)
Creatinine, Ser: 0.95 mg/dL (ref 0.40–1.20)
GFR: 61.1 mL/min (ref >60.00)
Glucose, Bld: 104 mg/dL — ABNORMAL HIGH (ref 70–99)
Potassium: 4.5 meq/L (ref 3.5–5.1)
Sodium: 141 meq/L (ref 135–145)
Total Bilirubin: 0.4 mg/dL (ref 0.2–1.2)
Total Protein: 6.7 g/dL (ref 6.0–8.3)

## 2024-05-25 LAB — LIPID PANEL
Cholesterol: 177 mg/dL (ref 0–200)
HDL: 41.6 mg/dL (ref >39.00)
LDL Cholesterol: 97 mg/dL (ref 0–99)
NonHDL: 135.68
Total CHOL/HDL Ratio: 4
Triglycerides: 191 mg/dL — ABNORMAL HIGH (ref 0.0–149.0)
VLDL: 38.2 mg/dL (ref 0.0–40.0)

## 2024-05-25 LAB — CBC WITH DIFFERENTIAL/PLATELET
Basophils Absolute: 0 K/uL (ref 0.0–0.1)
Basophils Relative: 0.4 % (ref 0.0–3.0)
Eosinophils Absolute: 0.2 K/uL (ref 0.0–0.7)
Eosinophils Relative: 2.9 % (ref 0.0–5.0)
HCT: 40.9 % (ref 36.0–46.0)
Hemoglobin: 13.6 g/dL (ref 12.0–15.0)
Lymphocytes Relative: 29.5 % (ref 12.0–46.0)
Lymphs Abs: 1.5 K/uL (ref 0.7–4.0)
MCHC: 33.4 g/dL (ref 30.0–36.0)
MCV: 90.5 fl (ref 78.0–100.0)
Monocytes Absolute: 0.3 K/uL (ref 0.1–1.0)
Monocytes Relative: 6.2 % (ref 3.0–12.0)
Neutro Abs: 3.2 K/uL (ref 1.4–7.7)
Neutrophils Relative %: 61 % (ref 43.0–77.0)
Platelets: 212 K/uL (ref 150.0–400.0)
RBC: 4.52 Mil/uL (ref 3.87–5.11)
RDW: 13.9 % (ref 11.5–15.5)
WBC: 5.2 K/uL (ref 4.0–10.5)

## 2024-05-25 LAB — MICROALBUMIN / CREATININE URINE RATIO
Creatinine,U: 35.3 mg/dL
Microalb Creat Ratio: UNDETERMINED mg/g (ref 0.0–30.0)
Microalb, Ur: <0.7 mg/dL

## 2024-05-25 LAB — HEMOGLOBIN A1C: Hgb A1c MFr Bld: 6.1 % (ref 4.6–6.5)

## 2024-05-25 NOTE — Patient Instructions (Addendum)
 Please stop by lab before you go If you have mychart- we will send your results within 3 business days of us  receiving them.  If you do not have mychart- we will call you about results within 5 business days of us  receiving them.  *please also note that you will see labs on mychart as soon as they post. I will later go in and write notes on them- will say notes from Dr. Katrinka   We have placed a referral for you today to Dr. Alm dermatology - please call their # if you do not hear within a week (may be listed below or you may see mychart message within a few days with #).    Recommended follow up: Return in about 1 year (around 05/25/2025) for physical or sooner if needed.Schedule b4 you leave.

## 2024-05-25 NOTE — Progress Notes (Signed)
 Phone [phone]   Subjective:  Patient presents today for their annual physical. Chief complaint-noted.   See problem oriented charting- ROS- full  review of systems was completed and negative except for topics noted under acute/chronic concerns  The following were reviewed and entered/updated in epic: Past Medical History:  Diagnosis Date   Allergy 1960   Hay fever   Arthritis 2024   Slowly creeping in   Asthma 1980 ish   Cataract    Chronic kidney disease    Depression    stress   Heart murmur    born with   Hyperlipidemia    Patient Active Problem List   Diagnosis Date Noted   Alopecia areata 10/27/2018    Priority: Medium    Asthma 10/27/2018    Priority: Medium    Genital herpes 05/07/2016    Priority: Medium    CKD (chronic kidney disease), stage III (HCC) 08/07/2014    Priority: Medium    Hyperglycemia 08/07/2014    Priority: Medium    Hyperlipidemia 04/02/2008    Priority: Medium    Tendon rupture, nontraumatic, extensor 03/21/2015    Priority: Low   Hormone replacement therapy 11/01/2014    Priority: Low   Stress at work 08/07/2014    Priority: Low   Injury of toenail 06/13/2014    Priority: Low   Hematuria 05/02/2010    Priority: Low   Strain of left pectoralis muscle 09/20/2015   Chest pain 09/20/2015   Past Surgical History:  Procedure Laterality Date   COLONOSCOPY     COSMETIC SURGERY     facial   EYE SURGERY  2004   Lasik   fibroid tumors     KNEE ARTHROSCOPY     TONSILLECTOMY AND ADENOIDECTOMY     as a child   WISDOM TOOTH EXTRACTION      Family History  Adopted: Yes  Problem Relation Age of Onset   Alcohol abuse Mother        Patient was adopted and not much else known   Cancer Maternal Grandmother    Colon cancer Neg Hx     Medications- reviewed and updated Current Outpatient Medications  Medication Sig Dispense Refill   acyclovir  (ZOVIRAX ) 200 MG capsule TAKE 1 CAPSULE BY MOUTH FIVE TIMES DAILY 25 capsule 3    COLLAGEN PO Take by mouth.     Multiple Vitamin (MULTIVITAMIN) tablet Take 1 tablet by mouth daily.     Omega-3 1000 MG CAPS Take 2,000 mg by mouth every morning.      sertraline  (ZOLOFT ) 50 MG tablet TAKE 1 TABLET DAILY 90 tablet 3   simvastatin  (ZOCOR ) 40 MG tablet TAKE 1 TABLET DAILY OR AS DIRECTED 90 tablet 3   No current facility-administered medications for this visit.    Allergies-reviewed and updated Allergies  Allergen Reactions   Fentanyl Nausea Only   Zocor  [Simvastatin ]     Muscle aches and pains    Social History   Social History Narrative   Married 2006 (together since 1984). No children.  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 Oropharynx normal Neck: no thyromegaly CV: RRR no murmurs rubs or gallops Lungs: CTAB no crackles, wheeze, rhonchi Abdomen: soft/nontender/nondistended/normal bowel sounds. No rebound or guarding.  Ext: no edema Skin: warm, dry Neuro: grossly normal, moves all extremities, PERRLA   Assessment and Plan   68 y.o. female presenting for annual physical.  Health Maintenance counseling: 1. Anticipatory guidance: Patient counseled regarding regular dental exams -q6 months, eye exams - yearly,  avoiding smoking and second hand smoke , limiting alcohol to 1 beverage per day-less than 6 a week , no illicit drugs .   2.  Risk factor reduction:  Advised patient of need for regular exercise and diet rich and fruits and vegetables to reduce risk of heart attack and stroke.  Exercise-4 days a week for 50 minutes or more- some yoga classes aded in.  Diet/weight management-mild weight gain from last visit- oscillates around this range- feels has added some muscle mass which is good. Would still like to get BMI under 30 if possible - has already reduced sugar.  Wt Readings from Last 3 Encounters:  05/25/24 194 lb 12.8 oz (88.4 kg)  10/26/23 187 lb (84.8 kg)  10/06/23 187 lb 12.8 oz (85.2 kg)  3. Immunizations/screenings/ancillary studies-fully up-to-date   Immunization History  Administered Date(s) Administered   Fluad  Quad(high Dose 65+) 04/25/2020, 04/10/2024   Fluad  Trivalent(High Dose 65+) 05/05/2023   INFLUENZA, HIGH DOSE SEASONAL PF 04/28/2021, 03/28/2022   Influenza Split 04/22/2012   Influenza,inj,Quad PF,6+ Mos 04/25/2013, 05/28/2014, 04/05/2015, 05/07/2016, 04/26/2017, 05/18/2018, 05/09/2019   Influenza-Unspecified 06/01/2014   Moderna Covid-19 Fall Seasonal Vaccine 40yrs & older 10/11/2023   PFIZER Comirnaty(Gray Top)Covid-19 Tri-Sucrose Vaccine 08/05/2022   PFIZER(Purple Top)SARS-COV-2 Vaccination 10/20/2019, 11/14/2019, 03/25/2020, 11/14/2020, 04/11/2021   PNEUMOCOCCAL CONJUGATE-20 09/23/2021   Pfizer Covid-19 Vaccine Bivalent Booster 50yrs & up 03/14/2022   Pfizer(Comirnaty)Fall Seasonal Vaccine 12 years and older 03/31/2023   Pneumococcal Polysaccharide-23 07/10/2020   Td 08/04/2003   Tdap 08/07/2014   Unspecified SARS-COV-2 Vaccination 05/01/2024   Zoster Recombinant(Shingrix) 04/12/2019, 06/17/2019   Zoster, Live 06/17/2019  4. Cervical cancer screening- follows with [facility] and Gynecology- done with pap smears 5. Breast cancer screening-  breast exam with GYN and mammogram on August 10, 2023 6. Colon cancer screening - 07/03/2016 with 10-year repeat 7. Skin cancer  screening-has seen Dr. Livingston in the past but hasn't been back since he reopened. advised regular sunscreen use. Denies worrisome, changing, or new skin lesions.  8. Birth control/STD check-postmenopausal monogamous 9. Osteoporosis screening at 65- 08/06/2020 with normal bone density 10. Smoking associated screening -never smoker  Status of chronic or acute concerns   #hyperlipidemia S: Medication:Simvastatin  40 mg-has produced over 30% reduction from peak LDL - We have been cautious about increasing further due to prediabetes risk  Lab Results  Component Value Date   CHOL 157 10/06/2023   HDL 41.30 10/06/2023   LDLCALC 83 10/06/2023   LDLDIRECT 124.0 03/23/2023   TRIG 160.0 (H) 10/06/2023   CHOLHDL 4 10/06/2023  A/P: lipids reasonably controlled- update today  # Chronic kidney disease stage III S: GFR typically in the 50s but can oscillate into the 60s -  Knows to avoid NSAIDs-only uses Tylenol   A/P: possible this is overestimated risk due to her excellent muscle mass- will add creatinine and GFR as well as UACR today    # Hyperglycemia/insulin resistance/prediabetes-A1c 6.0 August 2023 S:  Medication: None Lab Results  Component Value Date   HGBA1C 6.1 10/06/2023   HGBA1C 5.9 03/23/2023   HGBA1C 5.9 09/29/2022  A/P: slightl increase last visit- update today- to make sure not trending up  #situational stress- started on sertraline  50mg  with work- has wanted to continue  .   # Left knee pain-has seen Dr. Leonce and worked with PT-she reports much better. Physical therapy seemed to actually worsen pain.    Recommended follow up: Return in about 1 year (around 05/25/2025) for physical or sooner if needed.Schedule b4 you leave.  Lab/Order associations: fasting   ICD-10-CM   1. Preventative health care  Z00.00     2. Hyperlipidemia, unspecified hyperlipidemia type  E78.5 Comprehensive metabolic panel with GFR    CBC with Differential/Platelet    Lipid panel    3.  Hyperglycemia  R73.9 Hemoglobin A1c    4. Screening for diabetes mellitus  Z13.1 Hemoglobin A1c    5. Stage 3a chronic kidney disease (HCC)  N18.31 Microalbumin / creatinine urine ratio    6. Screening exam for skin cancer  Z12.83 Ambulatory referral to Dermatology      No orders of the defined types were placed in this encounter.   Return precautions advised.  Garnette Lukes, MD

## 2024-05-26 ENCOUNTER — Encounter: Payer: Self-pay | Admitting: Family Medicine

## 2024-05-26 MED ORDER — ICOSAPENT ETHYL 1 G PO CAPS: 2.0000 g | ORAL_CAPSULE | Freq: Two times a day (BID) | ORAL | 3 refills | Status: AC

## 2024-05-26 NOTE — Telephone Encounter (Signed)
 Please see pt msg and advise recommendations and needed orders

## 2024-05-30 ENCOUNTER — Other Ambulatory Visit (HOSPITAL_COMMUNITY): Payer: Self-pay

## 2024-05-30 ENCOUNTER — Telehealth: Payer: Self-pay

## 2024-05-30 NOTE — Telephone Encounter (Signed)
 Please see message and advise

## 2024-05-30 NOTE — Telephone Encounter (Signed)
 Team please let patient know that Vascepa was not covered-they want her to try both niacin and fenofibrate first-does she want to try fenofibrate?  Please remove the Vascepa from her med list

## 2024-05-30 NOTE — Telephone Encounter (Signed)
 Pharmacy Patient Advocate Encounter   Received notification from Onbase that prior authorization for ICOSAPENT ETHYL 1 GRAM CAPS is required/requested.   Insurance verification completed.   The patient is insured through [employer].   Per test claim: PA required; PA started via CoverMyMeds. KEY FAX . Please see clinical question(s) below that I am not finding the answer to in their chart and advise.

## 2024-06-12 ENCOUNTER — Ambulatory Visit: Admitting: Sports Medicine

## 2024-06-12 VITALS — HR 75 | Ht 67.0 in | Wt 195.0 lb

## 2024-06-12 DIAGNOSIS — M25511 Pain in right shoulder: Secondary | ICD-10-CM

## 2024-06-12 DIAGNOSIS — M7581 Other shoulder lesions, right shoulder: Secondary | ICD-10-CM | POA: Diagnosis not present

## 2024-06-12 NOTE — Patient Instructions (Signed)
 Shoulder HEP   As needed follow up if no improvement 2-4 weeks

## 2024-06-12 NOTE — Progress Notes (Signed)
 Ben Jaxson Keener D.[facility] [address] [place] [address] Phone: [phone]   Assessment and Plan:     1. Acute pain of right shoulder (Primary) 2. Rotator cuff tendinitis, right -Acute, initial visit - Most consistent with rotator cuff tendinitis, primarily supraspinatus based on HPI and physical exam - Patient elected for subacromial CSI.  Tolerated well per note below - Start HEP for rotator cuff - Use Tylenol  500 to 1000 mg tablets 2-3 times a day for day-to-day pain relief  Procedure: Subacromial Injection Side: Right  Risks explained and consent was given verbally. The site was cleaned with alcohol prep. A steroid injection was performed from posterior approach using 2mL of 1% lidocaine  without epinephrine  and 1mL of kenalog 40mg /ml. This was well tolerated.  Needle was removed, hemostasis achieved, and post injection instructions were explained.   Pt was advised to call or return to clinic if these symptoms worsen or fail to improve as anticipated.   15 additional minutes spent for educating Therapeutic Home Exercise Program.  This included exercises focusing on stretching, strengthening, with focus on eccentric aspects.   Long term goals include an improvement in range of motion, strength, endurance as well as avoiding reinjury. Patient's frequency would include in 1-2 times a day, 3-5 times a week for a duration of 6-12 weeks. Proper technique shown and discussed handout in great detail with ATC.  All questions were discussed and answered.      Pertinent previous records reviewed include none   Follow Up: As needed if no improvement or worsening of symptoms.  Could consider short NSAID course versus prednisone course versus physical therapy   Subjective:   I, Amber Jacobs, am serving as a neurosurgeon for Doctor Amber Jacobs  Chief Complaint: right shoulder   HPI:   06/12/24 Patient is a 68 year old female with right  shoulder soreness. Patient states pain started 2 weeks ago. She states she was very active the month of October. Lateral shoulder pain. ROM WNL. No numbness or tingling. Tylenol  PRN. No decrease in grip strength. 6-7/10 pain level for about 10 days    Relevant Historical Information: CKD stage III  Additional pertinent review of systems negative.   Current Outpatient Medications:    acyclovir  (ZOVIRAX ) 200 MG capsule, TAKE 1 CAPSULE BY MOUTH FIVE TIMES DAILY, Disp: 25 capsule, Rfl: 3   COLLAGEN PO, Take by mouth., Disp: , Rfl:    icosapent Ethyl (VASCEPA) 1 g capsule, Take 2 capsules (2 g total) by mouth 2 (two) times daily., Disp: 260 capsule, Rfl: 3   Multiple Vitamin (MULTIVITAMIN) tablet, Take 1 tablet by mouth daily., Disp: , Rfl:    sertraline  (ZOLOFT ) 50 MG tablet, TAKE 1 TABLET DAILY, Disp: 90 tablet, Rfl: 3   simvastatin  (ZOCOR ) 40 MG tablet, TAKE 1 TABLET DAILY OR AS DIRECTED, Disp: 90 tablet, Rfl: 3   Objective:     Vitals:   06/12/24 1356  Pulse: 75  SpO2: 97%  Weight: 195 lb (88.5 kg)  Height: 5' 7 (1.702 m)      Body mass index is 30.54 kg/m.    Physical Exam:    Gen: Appears well, nad, nontoxic and pleasant Neuro:sensation intact, strength is 5/5, muscle tone wnl Skin: no suspicious lesion or defmority Psych: A&O, appropriate mood and affect  Right shoulder:  No deformity, swelling or muscle wasting No scapular winging FF 180, abd 170, int 20, ext 90 TTP deltoid NTTP over the [place], clavicle, ac,  coracoid, biceps groove, humerus,  , trapezius, cervical spine Positive empty can Neg neer, hawkins,   obriens, crossarm, subscap liftoff, speeds Neg ant drawer, sulcus sign, apprehension Negative Spurling's test bilat FROM of neck    Electronically signed by:  Amber Jacobs D.[facility] 2:13 PM 06/12/24

## 2024-07-04 ENCOUNTER — Encounter: Payer: Self-pay | Admitting: Family Medicine

## 2024-08-14 ENCOUNTER — Other Ambulatory Visit: Payer: Self-pay | Admitting: Family Medicine

## 2024-08-31 ENCOUNTER — Other Ambulatory Visit: Payer: Self-pay | Admitting: Obstetrics & Gynecology

## 2024-08-31 DIAGNOSIS — Z1231 Encounter for screening mammogram for malignant neoplasm of breast: Secondary | ICD-10-CM

## 2024-10-05 ENCOUNTER — Ambulatory Visit

## 2025-01-03 ENCOUNTER — Ambulatory Visit: Admitting: Dermatology

## 2025-05-28 ENCOUNTER — Encounter: Admitting: Family Medicine
